# Patient Record
Sex: Female | Born: 1940 | Race: White | Hispanic: No | Marital: Married | Smoking: Never smoker
Health system: Southern US, Community
[De-identification: ages and names within clinical notes are randomized; demographics above are authoritative.]

## PROBLEM LIST (undated history)

## (undated) DIAGNOSIS — Z800 Family history of malignant neoplasm of digestive organs: Secondary | ICD-10-CM

## (undated) DIAGNOSIS — M1990 Unspecified osteoarthritis, unspecified site: Secondary | ICD-10-CM

## (undated) DIAGNOSIS — E079 Disorder of thyroid, unspecified: Secondary | ICD-10-CM

## (undated) DIAGNOSIS — F329 Major depressive disorder, single episode, unspecified: Secondary | ICD-10-CM

## (undated) DIAGNOSIS — K589 Irritable bowel syndrome without diarrhea: Secondary | ICD-10-CM

## (undated) DIAGNOSIS — F32A Depression, unspecified: Secondary | ICD-10-CM

## (undated) DIAGNOSIS — K5730 Diverticulosis of large intestine without perforation or abscess without bleeding: Secondary | ICD-10-CM

## (undated) DIAGNOSIS — K219 Gastro-esophageal reflux disease without esophagitis: Secondary | ICD-10-CM

## (undated) DIAGNOSIS — E7800 Pure hypercholesterolemia, unspecified: Secondary | ICD-10-CM

## (undated) DIAGNOSIS — I10 Essential (primary) hypertension: Secondary | ICD-10-CM

## (undated) DIAGNOSIS — F419 Anxiety disorder, unspecified: Secondary | ICD-10-CM

## (undated) HISTORY — DX: Disorder of thyroid, unspecified: E07.9

## (undated) HISTORY — DX: Pure hypercholesterolemia, unspecified: E78.00

## (undated) HISTORY — DX: Irritable bowel syndrome, unspecified: K58.9

## (undated) HISTORY — DX: Family history of malignant neoplasm of digestive organs: Z80.0

## (undated) HISTORY — DX: Diverticulosis of large intestine without perforation or abscess without bleeding: K57.30

## (undated) HISTORY — DX: Essential (primary) hypertension: I10

## (undated) HISTORY — DX: Unspecified osteoarthritis, unspecified site: M19.90

## (undated) HISTORY — DX: Anxiety disorder, unspecified: F41.9

## (undated) HISTORY — DX: Depression, unspecified: F32.A

## (undated) HISTORY — PX: KNEE SURGERY: SHX244

## (undated) HISTORY — DX: Major depressive disorder, single episode, unspecified: F32.9

## (undated) HISTORY — DX: Gastro-esophageal reflux disease without esophagitis: K21.9

---

## 1974-06-07 HISTORY — PX: PARTIAL HYSTERECTOMY: SHX80

## 1999-01-15 ENCOUNTER — Other Ambulatory Visit: Admission: RE | Admit: 1999-01-15 | Discharge: 1999-01-15 | Payer: Self-pay | Admitting: *Deleted

## 2001-05-28 ENCOUNTER — Encounter: Payer: Self-pay | Admitting: Emergency Medicine

## 2001-05-28 ENCOUNTER — Emergency Department (HOSPITAL_COMMUNITY): Admission: EM | Admit: 2001-05-28 | Discharge: 2001-05-28 | Payer: Self-pay | Admitting: Emergency Medicine

## 2003-03-15 ENCOUNTER — Ambulatory Visit (HOSPITAL_COMMUNITY): Admission: RE | Admit: 2003-03-15 | Discharge: 2003-03-15 | Payer: Self-pay | Admitting: *Deleted

## 2004-08-24 ENCOUNTER — Encounter: Admission: RE | Admit: 2004-08-24 | Discharge: 2004-08-24 | Payer: Self-pay | Admitting: Family Medicine

## 2006-11-16 ENCOUNTER — Encounter: Admission: RE | Admit: 2006-11-16 | Discharge: 2006-11-16 | Payer: Self-pay | Admitting: Family Medicine

## 2006-11-21 ENCOUNTER — Encounter: Admission: RE | Admit: 2006-11-21 | Discharge: 2006-11-21 | Payer: Self-pay | Admitting: Family Medicine

## 2007-01-16 ENCOUNTER — Ambulatory Visit (HOSPITAL_COMMUNITY): Admission: RE | Admit: 2007-01-16 | Discharge: 2007-01-16 | Payer: Self-pay | Admitting: General Surgery

## 2007-01-16 ENCOUNTER — Encounter (INDEPENDENT_AMBULATORY_CARE_PROVIDER_SITE_OTHER): Payer: Self-pay | Admitting: General Surgery

## 2007-01-16 HISTORY — PX: CHOLECYSTECTOMY: SHX55

## 2007-05-09 ENCOUNTER — Ambulatory Visit: Payer: Self-pay | Admitting: Gastroenterology

## 2007-05-10 ENCOUNTER — Ambulatory Visit: Payer: Self-pay | Admitting: Gastroenterology

## 2007-05-10 ENCOUNTER — Encounter: Payer: Self-pay | Admitting: Gastroenterology

## 2007-05-10 DIAGNOSIS — K5730 Diverticulosis of large intestine without perforation or abscess without bleeding: Secondary | ICD-10-CM

## 2007-05-10 HISTORY — DX: Diverticulosis of large intestine without perforation or abscess without bleeding: K57.30

## 2007-06-15 ENCOUNTER — Ambulatory Visit: Payer: Self-pay | Admitting: Gastroenterology

## 2007-06-28 DIAGNOSIS — F411 Generalized anxiety disorder: Secondary | ICD-10-CM | POA: Insufficient documentation

## 2007-06-28 DIAGNOSIS — M129 Arthropathy, unspecified: Secondary | ICD-10-CM | POA: Insufficient documentation

## 2007-06-28 DIAGNOSIS — R197 Diarrhea, unspecified: Secondary | ICD-10-CM

## 2007-06-28 DIAGNOSIS — E785 Hyperlipidemia, unspecified: Secondary | ICD-10-CM

## 2007-06-28 DIAGNOSIS — E079 Disorder of thyroid, unspecified: Secondary | ICD-10-CM | POA: Insufficient documentation

## 2007-06-28 DIAGNOSIS — K5730 Diverticulosis of large intestine without perforation or abscess without bleeding: Secondary | ICD-10-CM | POA: Insufficient documentation

## 2007-06-28 DIAGNOSIS — I10 Essential (primary) hypertension: Secondary | ICD-10-CM | POA: Insufficient documentation

## 2009-09-19 ENCOUNTER — Encounter: Admission: RE | Admit: 2009-09-19 | Discharge: 2009-09-19 | Payer: Self-pay | Admitting: Family Medicine

## 2010-10-20 NOTE — Assessment & Plan Note (Signed)
[facility]                         GASTROENTEROLOGY OFFICE NOTE   NAME:Christine Huynh, Christine Huynh                       MRN:          [id number]  DATE:05/09/2007                            DOB:          [birth year]    The patient is referred for evaluation of crampy lower abdominal pain  with urgent small volume diarrhea over the last 10-12 months.   HISTORY OF PRESENT ILLNESS:  The patient first developed liquid diarrhea  with abdominal cramping earlier this year.  There apparently was initial  impression that her diarrhea was secondary to metoprolol and Effexor  that she was taking because of anxiety and depression.  However, when  these medicines were discontinued, her diarrhea persisted.  It is  associated with tenesmus and lower abdominal discomfort and episodes of  self soiling, but no nocturnal diarrhea or rectal bleeding.  She had a  rather thorough workup by Dr. Nathanial Rancher which has included negative stool  exams and laboratory parameters.  She does have a chronic anxiety  disorder which seems to be fairly well managed at this time.  She has  been given p.r.n. Levsin tablets which help as do Imodium.   The patient denies foreign travel, although, she was in [place] in the  1970's and had dysentery at that time.  There are no sick family members  at home.  She denies repeated courses of antibiotics and has had  negative stool assay for Clostridium difficile.  Stool exams for culture  and ova and parasites have also been negative.  It is felt that she  might have lactose intolerance and she was placed on Lactaid tablets  again without improvement.  She denies associated skin rashes, joint  pains, oral stomatitis, or visual difficulty.  She had a colonoscopy as  a screening procedure with what sounds like [facility] in 2005.  I do not  have that report for review.  She has not had recent colonoscopy or  barium studies.  To me, she denies any specific food  intolerance and has  had no associated weight loss or systemic complaints such as fever,  chills, etc.   PAST MEDICAL HISTORY:  The patient has had essential hypertension for  many years, hyperlipidemia, chronic thyroid dysfunction, degenerative  arthritis, and lifetime anxiety.  She had a cholecystectomy performed  in August of 2008 because of acute cholecystitis and she had a negative  intraoperative cholangiogram.  This was performed by Anselm Pancoast.  Zachery Dakins, M.D.  I do have his report for review.  She also had a  hysterectomy in 1976 because of dysplastic cells.   REVIEW OF SYSTEMS:  Otherwise noncontributory.   MEDICATIONS:  1. Diltiazem XR 180 mg a day.  2. Hyoscyamine 0.125 mg one to two several times a day as needed for      abdominal cramps.  3. Fexofenadine 180 mg a day.  4. Lorazepam 0.5 mg twice a day.  5. Fluticasone nasal spray.  6. Multivitamin.   ALLERGIES:  She has had reactions in the past to PENICILLIN associated  skin rashes.   FAMILY HISTORY:  Remarkable for  colon cancer in a paternal uncle.  Her  mother had type 2 diabetes and atherosclerotic cardiovascular disease.   SOCIAL HISTORY:  She is married and lives with her husband.  She has a  business school degree and is retired from working in the [employer].  She does not smoke and has never abused alcohol.   REVIEW OF SYSTEMS:  Otherwise noncontributory except for occasional  nonspecific palpitations and arthralgias.  She denies any other  cardiovascular, pulmonary, genitourinary, neurologic, orthopedic, or  endocrine problems.   PHYSICAL EXAMINATION:  GENERAL:  She is a healthy appearing, attractive  white female in no distress, appearing younger than her stated age.  I  cannot appreciate stigmata of chronic liver disease, or thyromegaly.  VITAL SIGNS:  She is 5 feet 3-1/2 inches and weighs 176 pounds.  Blood  pressure 152/80, pulse 88 and regular.  CHEST:  Clear.  HEART:  Regular  rhythm without murmurs, rubs, or gallops.  ABDOMEN:  No hepatosplenomegaly, abdominal masses, or significant  tenderness.  EXTREMITIES:  Peripheral extremities were unremarkable.  NEUROLOGY:  Mental status was clear.  RECTAL:  Very scant stool in the rectal vault that was guaiac negative.   ASSESSMENT:  1. Watery diarrhea most consistent with colitic process.  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  2. Well controlled hypertension with associated hypercholesterolemia.  3. History of chronic thyroid dysfunction, but she is not on thyroid      medication at this time.  4. History of penicillin allergy.  5. Status post cholecystectomy for cholelithiasis.  6. Status post hysterectomy for cervical dysplasia.   RECOMMENDATIONS:  I have gone ahead and set the patient up for  colonoscopy with random colon biopsies as soon as possible and to  proceed accordingly depending on these results.  She is to continue her  other medications and follow up with Dr. Nathanial Rancher as previously planned.     Vania Rea. Jarold Motto, MD, Caleen Essex, FAGA  Electronically Signed    DRP/MedQ  DD: 05/09/2007  DT: 05/09/2007  Job #: [id number]   cc:   Burnell Blanks, MD

## 2010-10-20 NOTE — Op Note (Signed)
Christine, Huynh                ACCOUNT NO.:  [id number]   MEDICAL RECORD NO.:  [id number]          PATIENT TYPE:  AMB   LOCATION:  DAY                          FACILITY:  [facility]   PHYSICIAN:  Anselm Pancoast. Weatherly, M.D.DATE OF BIRTH:  [birth year]   DATE OF PROCEDURE:  01/16/2007  DATE OF DISCHARGE:                               OPERATIVE REPORT   PREOPERATIVE DIAGNOSIS:  Chronic cholecystitis with stones.   POSTOPERATIVE DIAGNOSIS:  Chronic cholecystitis with stones.   OPERATION:  Laparoscopic cholecystectomy with cholangiogram.   SURGEON:  Anselm Pancoast. Zachery Dakins, M.D.   ASSISTANT:  Ollen Gross. Vernell Morgans, M.D.   ANESTHESIA:  General anesthesia.   HISTORY:  Christine Huynh is a 70 year old moderately overweight female who  was referred to me by Dr. Nathanial Rancher for symptomatic gallstones.  The  patient is on Effexor 75 mg one cap daily and metoprolol and has had  episodes of pain which first occurred about 2 months ago after eating  and then she has had some minor episodes of cramping and bloating  discomfort.  She has a little reflux esophagitis which is treated  symptomatically and she had an ultrasound of the gallbladder that showed  stones, but no thickening or signs of acute cholecystitis.  She had had  knee surgery recently by Dr. Simeon Craft, has had colonoscopy and is on  blood pressure medications.  She denies any symptoms and sound like  exertional angina.  She is a retired Museum/gallery curator.   DESCRIPTION OF PROCEDURE:  Preoperatively, she was taken to the  operative suite with induction of general anesthesia, endotracheal tube,  oral tube into the stomach and then the abdomen was prepped with  Betadine solution and draped in a sterile manner.  The patient had  received preoperatively 400 mg of Cipro, as she is allergic to  penicillin.  After induction of general anesthesia, endotracheal tube,  oral tube and then after draping, a small incision made below the  umbilicus.   It looks as if she has possibly had a laparoscopic surgery  previously.  The fascia was picked up and then carefully entered into  the peritoneal cavity.  On entering into, I could feel a little adhesion  right at the undersurface that I broke down with my finger and then a  pursestring suture was placed and a Hasson cannula introduced.  The  gallbladder was not acutely inflamed, but was dilated.  The upper 10 mL  trocar was placed in subxiphoid area in the appropriate position and the  two lateral 5-mm trocars were placed after anesthetizing the fascia by  Dr. Carolynne Edouard at the appropriate position.  The gallbladder was retracted  upward and outward and the peritoneum over the proximal portion of the  gallbladder was carefully separated.  We could identify the cystic duct  and cystic artery.  I opened up the peritoneum posterior to the lesser  side so that we could get a good window.  After we were definitely sure  of the anatomy, 2 clips were placed proximally on the cystic artery and  one distally and it was then  divided to allow [place] very good exposure of  the cystic duct.  I then placed a clip across the cystic duct-  gallbladder junction and made a little opening proximally and introduced  a Cook catheter, held in place with clip.  A cholangiogram showed a good  flow of the biliary system, intra and extrahepatic radicles and no  evidence of any dilatation or stone.  The catheter was removed.  Three  clips were placed across the proximal cystic duct and it was divided and  then the gallbladder was freed from its bed using the hook  electrocautery with good hemostasis.  I placed 1 clip on kind of the  peritoneal reflection inferior that may have a little artery there and  then divided it with no evidence of any bleeding.  The gallbladder was  placed in the EndoCatch bag and camera switched to the upper 10-mm port  and the gallbladder within the bag was removed.  The little irrigating  fluid  that was minimal was aspirated, no evidence of bleeding or bile,  and then an additional figure-of-eight suture was placed under direct  vision with the laparoscopic at the umbilicus and both it and the  pursestring suture tied.  I then anesthetized the fascia at the  umbilicus, withdrew the 5-mm ports under direct vision, released the  CO2, then withdrew the upper 10-mm port.  The subcutaneous wounds were  closed with 4-0 Vicryl and Benzoin and Steri-Strips were placed on the  skin.  The patient desires to spend the night and go home in the  morning.           ______________________________  Anselm Pancoast. Zachery Dakins, M.D.     WJW/MEDQ  D:  01/16/2007  T:  01/17/2007  Job:  [id number]   cc:   Burnell Blanks, MD  Fax: [phone]

## 2010-10-20 NOTE — Assessment & Plan Note (Signed)
[facility]                         GASTROENTEROLOGY OFFICE NOTE   NAME:Christine Huynh, Christine Huynh                       MRN:          [id number]  DATE:06/15/2007                            DOB:          28-Sep-1940    Christine Huynh underwent colonoscopy with multiple random colon biopsies on  May 10, 2007.  These biopsies were entirely normal.  She did have  significant diverticulosis.  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  She denies any other GI complaints.  Dr. Nathanial Rancher wants to know if she can go back on Effexor for her anxiety  and depression.   PHYSICAL EXAMINATION:  VITAL SIGNS:  Her vital signs are all stable, and  she weighs 172 pounds.  GENERAL:  Exam not repeated.   ASSESSMENT:  I think Christine Huynh has symptomatic diverticulosis with  sigmoid colon dysmotility and frequent stooling.  She also has an  element, I think, of bile salt enteropathy, doing better with  cholestyramine therapy.   RECOMMENDATIONS:  1. Continue cholestyramine 4 grams with juice in mid a.m. 2 hours      apart from other medications.  2. Benefiber 1 teaspoon in juice at bedtime.  3. Trial of Pamine Forte 2 mg before bedtime.   ADDENDUM:  I think it would be alright for Christine Huynh to restart Effexor at  this time and will see how she does symptomatically.  I will send these  recommendations on to Dr. Nathanial Rancher.     Christine Rea. Jarold Motto, MD, Caleen Essex, FAGA  Electronically Signed    DRP/MedQ  DD: 06/15/2007  DT: 06/15/2007  Job #: [id number]   cc:   Burnell Blanks, MD

## 2010-10-20 NOTE — Assessment & Plan Note (Signed)
 [facility]                         GASTROENTEROLOGY OFFICE NOTE   NAME:Riviere, PENI RUPARD                       MRN:          [id number]  DATE:06/15/2007                            DOB:          07-02-1940    CONTINUATION   I just dictated her note, dictation June 15, 2007.  I think it would  be alright for Kimber to restart Effexor at this time and will see how she  does symptomatically.  I will send these recommendations on to Dr.  Nathanial Rancher.     Vania Rea. Jarold Motto, MD, Caleen Essex, FAGA     DRP/MedQ  DD: 06/15/2007  DT: 06/15/2007  Job #: [id number]

## 2011-03-22 LAB — COMPREHENSIVE METABOLIC PANEL
ALT: 17
Alkaline Phosphatase: 68
CO2: 27
Glucose, Bld: 101 — ABNORMAL HIGH
Potassium: 4.2
Sodium: 141
Total Protein: 7.1

## 2011-03-22 LAB — DIFFERENTIAL
Basophils Relative: 0
Eosinophils Absolute: 0.1
Eosinophils Relative: 2
Monocytes Relative: 6
Neutrophils Relative %: 63

## 2011-03-22 LAB — CBC
Hemoglobin: 12.7
RBC: 4.05
RDW: 13.4

## 2011-09-21 ENCOUNTER — Encounter: Payer: Self-pay | Admitting: Gastroenterology

## 2011-10-06 ENCOUNTER — Encounter: Payer: Self-pay | Admitting: *Deleted

## 2011-10-08 ENCOUNTER — Ambulatory Visit (INDEPENDENT_AMBULATORY_CARE_PROVIDER_SITE_OTHER): Payer: Medicare Other | Admitting: Gastroenterology

## 2011-10-08 ENCOUNTER — Encounter: Payer: Self-pay | Admitting: Gastroenterology

## 2011-10-08 ENCOUNTER — Other Ambulatory Visit (INDEPENDENT_AMBULATORY_CARE_PROVIDER_SITE_OTHER): Payer: Medicare Other

## 2011-10-08 VITALS — BP 128/74 | HR 60 | Ht 63.0 in | Wt 143.0 lb

## 2011-10-08 DIAGNOSIS — K5732 Diverticulitis of large intestine without perforation or abscess without bleeding: Secondary | ICD-10-CM

## 2011-10-08 DIAGNOSIS — K5792 Diverticulitis of intestine, part unspecified, without perforation or abscess without bleeding: Secondary | ICD-10-CM

## 2011-10-08 DIAGNOSIS — R198 Other specified symptoms and signs involving the digestive system and abdomen: Secondary | ICD-10-CM

## 2011-10-08 DIAGNOSIS — E538 Deficiency of other specified B group vitamins: Secondary | ICD-10-CM

## 2011-10-08 DIAGNOSIS — K5733 Diverticulitis of large intestine without perforation or abscess with bleeding: Secondary | ICD-10-CM

## 2011-10-08 DIAGNOSIS — R1032 Left lower quadrant pain: Secondary | ICD-10-CM

## 2011-10-08 DIAGNOSIS — K219 Gastro-esophageal reflux disease without esophagitis: Secondary | ICD-10-CM

## 2011-10-08 DIAGNOSIS — R52 Pain, unspecified: Secondary | ICD-10-CM

## 2011-10-08 LAB — FERRITIN: Ferritin: 61.4 ng/mL (ref 10.0–291.0)

## 2011-10-08 LAB — BASIC METABOLIC PANEL
BUN: 10 mg/dL (ref 6–23)
Calcium: 9.3 mg/dL (ref 8.4–10.5)
Creatinine, Ser: 0.8 mg/dL (ref 0.4–1.2)
GFR: 74.28 mL/min (ref >60.00)
Potassium: 3.9 mEq/L (ref 3.5–5.1)

## 2011-10-08 LAB — SEDIMENTATION RATE: Sed Rate: 22 mm/hr (ref 0–22)

## 2011-10-08 LAB — CBC WITH DIFFERENTIAL/PLATELET
Basophils Relative: 0.6 % (ref 0.0–3.0)
Eosinophils Relative: 2.5 % (ref 0.0–5.0)
HCT: 38.9 % (ref 36.0–46.0)
Hemoglobin: 12.8 g/dL (ref 12.0–15.0)
Lymphs Abs: 1.4 10*3/uL (ref 0.7–4.0)
MCV: 96.1 fl (ref 78.0–100.0)
Monocytes Absolute: 0.3 10*3/uL (ref 0.1–1.0)
Monocytes Relative: 5.9 % (ref 3.0–12.0)
RBC: 4.05 Mil/uL (ref 3.87–5.11)
WBC: 5.6 10*3/uL (ref 4.5–10.5)

## 2011-10-08 LAB — IBC PANEL: Saturation Ratios: 26 % (ref 20.0–50.0)

## 2011-10-08 LAB — VITAMIN B12: Vitamin B-12: 647 pg/mL (ref 211–911)

## 2011-10-08 LAB — HEPATIC FUNCTION PANEL
AST: 14 U/L (ref 0–37)
Total Bilirubin: 0.6 mg/dL (ref 0.3–1.2)

## 2011-10-08 MED ORDER — CILIDINIUM-CHLORDIAZEPOXIDE 2.5-5 MG PO CAPS: 1.0000 | ORAL_CAPSULE | Freq: Three times a day (TID) | ORAL | Status: DC

## 2011-10-08 MED ORDER — MOVIPREP 100 G PO SOLR: 1.0000 | Freq: Once | ORAL | Status: DC

## 2011-10-08 MED ORDER — ESOMEPRAZOLE MAGNESIUM 40 MG PO CPDR: 40.0000 mg | DELAYED_RELEASE_CAPSULE | Freq: Every day | ORAL | Status: DC

## 2011-10-08 NOTE — Progress Notes (Addendum)
History of Present Illness:  This is a 71 year old Caucasian female who I previously saw in 2008 with symptomatic diverticulosis. At that time colonoscopy otherwise was unremarkable including random colon biopsies. 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 She now presents with one-month history of left lower quadrant discomfort partially relieved by Ceturoxone 500 mg twice a day for 10 days by her primary care physician. Patient has alternating diarrhea and constipation, continued vague discomfort in her left lower quadrant, but no fever, chills, melena, or rectal bleeding. She does complain of some gas and bloating but no systemic complaints. In addition, she also has had the onset of substernal-subxiphoid pain with some dysphagia. When necessary Levsin has not helped her discomfort. Family history is remarkable for an uncle with colon cancer her mother had colon polyps. She has a regular diet and denies use of sorbitol, or fructose, but does use lactose-free milk. There is no history of abuse of alcohol, cigarettes, or NSAIDs. In addition to her cholecystectomy she has had a partial hysterectomy.  I have reviewed this patient's present history, medical and surgical past history, allergies and medications.     ROS: The remainder of the 10 point ROS is negative.. she does complain of allergic sinusitis, chronic anxiety and depression, degenerative arthritis of her hands and neck, chronic low back pain, drive spell some mild confusion, chronic fatigue, and recurrent sinusitis with headaches.     Physical Exam:Blood pressure 128/74, pulse 64 and regular, weight 143 pounds.  General well developed well nourished patient in no acute distress, appearing their stated age Eyes PERRLA, no icterus, fundoscopic exam per opthamologist Skin no lesions noted Neck supple, no adenopathy, no thyroid enlargement, no tenderness Chest clear to percussion and  auscultation Heart no significant murmurs, gallops or rubs noted Abdomen no hepatosplenomegaly masses or tenderness, BS normal.  Rectal inspection normal no fissures, or fistulae noted.  No masses or tenderness on digital exam. Stool guaiac negative. Extremities no acute joint lesions, edema, phlebitis or evidence of cellulitis. Neurologic patient oriented x 3, cranial nerves intact, no focal neurologic deficits noted. Psychological mental status normal and normal affect.  Assessment and plan:Resolving diverticulitis, rule out persistent inflammation, fistulization, or sigmoid stenosis. I've scheduled her for CT scan of the abdomen, CBC, sed rate, and metabolic profile. I prescribed a high fiber diet with daily Metamucil, liberal by mouth fluids, Librax 3 times a day, and also Nexium 40 mg a day for chronic acid reflux. Because of her dysphagia, I have scheduled her for endoscopy with propofol sedation, and have tentatively scheduled colonoscopy in several weeks depending on her CT scan and clinical course. She's to continue her other medications as listed and reviewed her records.  Encounter Diagnoses  Name Primary?  [name] LLQ abdominal pain Yes  . Diverticulitis

## 2011-10-08 NOTE — Patient Instructions (Addendum)
Your physician has requested that you go to the basement for the following lab work before leaving today:BUN, Creatine.  We have sent the following medications to your pharmacy for you to pick up at your convenience:Librax and Nexium. Start taking Metamucil daily.  High Fiber Diet A high fiber diet changes your normal diet to include more whole grains, legumes, fruits, and vegetables. Changes in the diet involve replacing refined carbohydrates with unrefined foods. The calorie level of the diet is essentially unchanged. The Dietary Reference Intake (recommended amount) for adult males is 38 g per day. For adult females, it is 25 g per day. Pregnant and lactating women should consume 28 g of fiber per day. Fiber is the intact part of a plant that is not broken down during digestion. Functional fiber is fiber that has been isolated from the plant to provide a beneficial effect in the body. PURPOSE  Increase stool bulk.   Ease and regulate bowel movements.   Lower cholesterol.  INDICATIONS THAT YOU NEED MORE FIBER  Constipation and hemorrhoids.   Uncomplicated diverticulosis (intestine condition) and irritable bowel syndrome.   Weight management.   As a protective measure against hardening of the arteries (atherosclerosis), diabetes, and cancer.  NOTE OF CAUTION If you have a digestive or bowel problem, ask your caregiver for advice before adding high fiber foods to your diet. Some of the following medical problems are such that a high fiber diet should not be used without consulting your caregiver:  Acute diverticulitis (intestine infection).   Partial small bowel obstructions.   Complicated diverticular disease involving bleeding, rupture (perforation), or abscess (boil, furuncle).   Presence of autonomic neuropathy (nerve damage) or gastric paresis (stomach cannot empty itself).  GUIDELINES FOR INCREASING FIBER  Start adding fiber to the diet slowly. A gradual increase of about 5  more grams (2 slices of whole-wheat bread, 2 servings of most fruits or vegetables, or 1 bowl of high fiber cereal) per day is best. Too rapid an increase in fiber may result in constipation, flatulence, and bloating.   Drink enough water and fluids to keep your urine clear or pale yellow. Water, juice, or caffeine-free drinks are recommended. Not drinking enough fluid may cause constipation.   Eat a variety of high fiber foods rather than one type of fiber.   Try to increase your intake of fiber through using high fiber foods rather than fiber pills or supplements that contain small amounts of fiber.   The goal is to change the types of food eaten. Do not supplement your present diet with high fiber foods, but replace foods in your present diet.  INCLUDE A VARIETY OF FIBER SOURCES  Replace refined and processed grains with whole grains, canned fruits with fresh fruits, and incorporate other fiber sources. White rice, white breads, and most bakery goods contain little or no fiber.   Brown whole-grain rice, buckwheat oats, and many fruits and vegetables are all good sources of fiber. These include: broccoli, Brussels sprouts, cabbage, cauliflower, beets, sweet potatoes, white potatoes (skin on), carrots, tomatoes, eggplant, squash, berries, fresh fruits, and dried fruits.   Cereals appear to be the richest source of fiber. Cereal fiber is found in whole grains and bran. Bran is the fiber-rich outer coat of cereal grain, which is largely removed in refining. In whole-grain cereals, the bran remains. In breakfast cereals, the largest amount of fiber is found in those with "bran" in their names. The fiber content is sometimes indicated on the label.  You may need to include additional fruits and vegetables each day.   In baking, for 1 cup white flour, you may use the following substitutions:   1 cup whole-wheat flour minus 2 tbs.    cup white flour plus  cup whole-wheat flour.  Document  Released: 05/24/2005 Document Revised: 05/13/2011 Document Reviewed: 04/01/2009 [facility] Patient Information 2012 [place], [place].      You have been scheduled for a CT scan of the abdomen and pelvis at [facility] ([address]---this is in the same building as Architectural technologist).   You are scheduled on 10/11/11 at 9:00am. You should arrive 15 minutes prior to your appointment time for registration. Please follow the written instructions below on the day of your exam:  WARNING: IF YOU ARE ALLERGIC TO IODINE/X-RAY DYE, PLEASE NOTIFY RADIOLOGY IMMEDIATELY AT [phone]! YOU WILL BE GIVEN A 13 HOUR PREMEDICATION PREP.  1) Do not eat or drink anything after 5:00am (4 hours prior to your test) 2) You have been given 2 bottles of oral contrast to drink. The solution may taste               better if refrigerated, but do NOT add ice or any other liquid to this solution. Shake             well before drinking.    Drink 1 bottle of contrast @ 7:00am (2 hours prior to your exam)  Drink 1 bottle of contrast @ 8:00am (1 hour prior to your exam)  You may take any medications as prescribed with a small amount of water except for the following: Metformin, Glucophage, Glucovance, Avandamet, Riomet, Fortamet, Actoplus Met, Janumet, Glumetza or Metaglip. The above medications must be held the day of the exam AND 48 hours after the exam.  The purpose of you drinking the oral contrast is to aid in the visualization of your intestinal tract. The contrast solution may cause some diarrhea. Before your exam is started, you will be given a small amount of fluid to drink. Depending on your individual set of symptoms, you may also receive an intravenous injection of x-ray contrast/dye. Plan on being at [facility] for 30 minutes or long, depending on the type of exam you are having performed.  If you have any questions regarding your exam or if you need to reschedule, you may call the CT  department at [phone] between the hours of 8:00 am and 5:00 pm, Monday-Friday.  ________________________________________________________________________ Bonita Quin have been scheduled for an endoscopy and colonoscopy with propofol. Please follow the written instructions given to you at your visit today. Please pick up your prep at the pharmacy within the next 1-3 days.  cc: Burnell Blanks, MD

## 2011-10-11 ENCOUNTER — Ambulatory Visit (INDEPENDENT_AMBULATORY_CARE_PROVIDER_SITE_OTHER)
Admission: RE | Admit: 2011-10-11 | Discharge: 2011-10-11 | Disposition: A | Discharge status: Home / Self Care | Payer: Medicare Other | Source: Ambulatory Visit | Attending: Gastroenterology | Admitting: Gastroenterology

## 2011-10-11 DIAGNOSIS — R1032 Left lower quadrant pain: Secondary | ICD-10-CM

## 2011-10-11 DIAGNOSIS — K5792 Diverticulitis of intestine, part unspecified, without perforation or abscess without bleeding: Secondary | ICD-10-CM

## 2011-10-11 DIAGNOSIS — K5732 Diverticulitis of large intestine without perforation or abscess without bleeding: Secondary | ICD-10-CM

## 2011-10-11 LAB — CELIAC PANEL 10
Gliadin IgA: 1.7 U/mL (ref <20)
IgA: 103 mg/dL (ref 69–380)

## 2011-10-11 MED ORDER — IOHEXOL 300 MG/ML  SOLN
100.0000 mL | Freq: Once | INTRAMUSCULAR | Status: AC | PRN
  Administered 2011-10-11: 100 mL via INTRAVENOUS

## 2011-11-10 ENCOUNTER — Ambulatory Visit (AMBULATORY_SURGERY_CENTER): Payer: Medicare Other | Admitting: Gastroenterology

## 2011-11-10 ENCOUNTER — Encounter: Payer: Self-pay | Admitting: Gastroenterology

## 2011-11-10 VITALS — BP 145/76 | HR 84 | Temp 97.1°F | Resp 12 | Ht 63.0 in | Wt 143.0 lb

## 2011-11-10 DIAGNOSIS — R1032 Left lower quadrant pain: Secondary | ICD-10-CM

## 2011-11-10 DIAGNOSIS — Z1211 Encounter for screening for malignant neoplasm of colon: Secondary | ICD-10-CM

## 2011-11-10 DIAGNOSIS — N736 Female pelvic peritoneal adhesions (postinfective): Secondary | ICD-10-CM

## 2011-11-10 DIAGNOSIS — K219 Gastro-esophageal reflux disease without esophagitis: Secondary | ICD-10-CM

## 2011-11-10 DIAGNOSIS — K5732 Diverticulitis of large intestine without perforation or abscess without bleeding: Secondary | ICD-10-CM

## 2011-11-10 MED ORDER — SODIUM CHLORIDE 0.9 % IV SOLN: 500.0000 mL | INTRAVENOUS | Status: DC

## 2011-11-10 NOTE — Progress Notes (Signed)
Patient did not experience any of the following events: a burn prior to discharge; a fall within the facility; wrong site/side/patient/procedure/implant event; or a hospital transfer or hospital admission upon discharge from the facility. (G8907) Patient did not have preoperative order for IV antibiotic SSI prophylaxis. (G8918)  

## 2011-11-10 NOTE — Op Note (Signed)
[facility] [address] [place], [place]  [address]  ENDOSCOPY PROCEDURE REPORT  PATIENT:  Christine Huynh, Christine Huynh  MR#:  [id number] BIRTHDATE:  01-May-1941, 71 yrs. old  GENDER:  female  ENDOSCOPIST:  Vania Rea. Jarold Motto, MD, [facility] Referred by:  PROCEDURE DATE:  11/10/2011 PROCEDURE:  EGD, diagnostic 62130 ASA CLASS:  Class III INDICATIONS:  GERD ?? dysphagia.resolved on PPi Rx  MEDICATIONS:   There was residual sedation effect present from prior procedure., propofol (Diprivan) 60 mg IV TOPICAL ANESTHETIC:  DESCRIPTION OF PROCEDURE:   After the risks and benefits of the procedure were explained, informed consent was obtained.  [facility] GIF-H180 [id number] endoscope was introduced through the mouth and advanced to the second portion of the duodenum.  The instrument was slowly withdrawn as the mucosa was fully examined. <<PROCEDUREIMAGES>>  The upper, middle, and distal third of the esophagus were carefully inspected and no abnormalities were noted. The z-line was well seen at the GEJ. The endoscope was pushed into the fundus which was normal including a retroflexed view. The antrum,gastric body, first and second part of the duodenum were unremarkable. Retroflexed views revealed no abnormalities.    The scope was then withdrawn from the patient and the procedure completed.  COMPLICATIONS:  None  ENDOSCOPIC IMPRESSION: 1) Normal EGD NONEROSIVE GERD ON PPI RX. RECOMMENDATIONS: 1) continue PPI 2) continue current medications  ______________________________ Vania Rea. Jarold Motto, MD, Clementeen Graham  CC:  Burnell Blanks, MD  n. Christine DoctorVania Rea. Symir Mah at 11/10/2011 02:10 PM  Trenton Gammon, [phone]

## 2011-11-10 NOTE — Patient Instructions (Signed)
YOU HAD AN ENDOSCOPIC PROCEDURE TODAY AT THE [facility]: Refer to the procedure report that was given to you for any specific questions about what was found during the examination.  If the procedure report does not answer your questions, please call your gastroenterologist to clarify.  If you requested that your care partner not be given the details of your procedure findings, then the procedure report has been included in a sealed envelope for you to review at your convenience later.  YOU SHOULD EXPECT: Some feelings of bloating in the abdomen. Passage of more gas than usual.  Walking can help get rid of the air that was put into your GI tract during the procedure and reduce the bloating. If you had a lower endoscopy (such as a colonoscopy or flexible sigmoidoscopy) you may notice spotting of blood in your stool or on the toilet paper. If you underwent a bowel prep for your procedure, then you may not have a normal bowel movement for a few days.  DIET: Your first meal following the procedure should be a light meal and then it is ok to progress to your normal diet.  A half-sandwich or bowl of soup is an example of a good first meal.  Heavy or fried foods are harder to digest and may make you feel nauseous or bloated.  Likewise meals heavy in dairy and vegetables can cause extra gas to form and this can also increase the bloating.  Drink plenty of fluids but you should avoid alcoholic beverages for 24 hours.  ACTIVITY: Your care partner should take you home directly after the procedure.  You should plan to take it easy, moving slowly for the rest of the day.  You can resume normal activity the day after the procedure however you should NOT DRIVE or use heavy machinery for 24 hours (because of the sedation medicines used during the test).    SYMPTOMS TO REPORT IMMEDIATELY: A gastroenterologist can be reached at any hour.  During normal business hours, 8:30 AM to 5:00 PM Monday through Friday,  call [phone].  After hours and on weekends, please call the GI answering service at [phone] who will take a message and have the physician on call contact you.   Following lower endoscopy (colonoscopy or flexible sigmoidoscopy):  Excessive amounts of blood in the stool  Significant tenderness or worsening of abdominal pains  Swelling of the abdomen that is new, acute  Fever of 100F or higher  Following upper endoscopy (EGD)  Vomiting of blood or coffee ground material  New chest pain or pain under the shoulder blades  Painful or persistently difficult swallowing  New shortness of breath  Fever of 100F or higher  Black, tarry-looking stools  FOLLOW UP: If any biopsies were taken you will be contacted by phone or by letter within the next 1-3 weeks.  Call your gastroenterologist if you have not heard about the biopsies in 3 weeks.  Our staff will call the home number listed on your records the next business day following your procedure to check on you and address any questions or concerns that you may have at that time regarding the information given to you following your procedure. This is a courtesy call and so if there is no answer at the home number and we have not heard from you through the emergency physician on call, we will assume that you have returned to your regular daily activities without incident.  SIGNATURES/CONFIDENTIALITY: You and/or your care   partner have signed paperwork which will be entered into your electronic medical record.  These signatures attest to the fact that that the information above on your After Visit Summary has been reviewed and is understood.  Full responsibility of the confidentiality of this discharge information lies with you and/or your care-partner.   Upper GI endoscopy is normal  Continue current medications.  Colonoscopy-no poyps or cancers.  Pelvic-colonic adhesions (scar tissue).  High fiber diet with liberal fluid intake.  Use  one heaping teaspoon of metamucil in liquid every day until visit with Dr. Jarold Motto in one month.  Call Dr. Norval Gable office for follow-up in one month per his request.  Call withing next couple of days for this appointment.

## 2011-11-10 NOTE — Op Note (Signed)
[facility] [address] [place], [place]  [address]  COLONOSCOPY PROCEDURE REPORT  PATIENT:  Christine Huynh, Christine Huynh  MR#:  [id number] BIRTHDATE:  Sep 20, 1940, 71 yrs. old  GENDER:  female ENDOSCOPIST:  Vania Rea. Jarold Motto, MD, [facility] REF. BY: PROCEDURE DATE:  11/10/2011 PROCEDURE:  Diagnostic Colonoscopy ASA CLASS:  Class II INDICATIONS:  Abdominal pain MEDICATIONS:   propofol (Diprivan) 200 mg IV  DESCRIPTION OF PROCEDURE:   After the risks and benefits and of the procedure were explained, informed consent was obtained. Digital rectal exam was performed and revealed no abnormalities. The LB [id number] endoscope was introduced through the anus and advanced to the cecum, which was identified by both the appendix and ileocecal valve.  The quality of the prep was excellent, using MoviPrep.  The instrument was then slowly withdrawn as the colon was fully examined. <<PROCEDUREIMAGES>>  FINDINGS:  No polyps or cancers were seen.  This was otherwise a normal examination of the colon. adhesions in sigmoid area.hard to pass. ?? stenosis.   Retroflexed views in the rectum revealed no abnormalities.    The scope was then withdrawn from the patient and the procedure completed.  COMPLICATIONS:  None ENDOSCOPIC IMPRESSION: 1) No polyps or cancers 2) Otherwise normal examination pelvic-colonic adhesions. RECOMMENDATIONS: 1) Continue current medications 2) High fiber diet with liberal fluid intake. 3) metamucil or benefiber  REPEAT EXAM:  No  ______________________________ Vania Rea. Jarold Motto, MD, Clementeen Graham  CC:  Burnell Blanks, MD  n. Rosalie DoctorVania Rea. Evea Sheek at 11/10/2011 02:05 PM  Trenton Gammon, [phone]

## 2011-11-10 NOTE — Progress Notes (Signed)
Propofol per s camp crna. See scanned intra procedure report. ewm 

## 2011-11-11 ENCOUNTER — Telehealth: Payer: Self-pay | Admitting: *Deleted

## 2011-11-11 NOTE — Telephone Encounter (Signed)
  Follow up Call-  Call back number 11/10/2011  Post procedure Call Back phone  # [phone]  Permission to leave phone message Yes     No answer and  answering machine did not pick up

## 2011-12-07 ENCOUNTER — Ambulatory Visit (INDEPENDENT_AMBULATORY_CARE_PROVIDER_SITE_OTHER): Payer: Medicare Other | Admitting: Gastroenterology

## 2011-12-07 ENCOUNTER — Encounter: Payer: Self-pay | Admitting: Gastroenterology

## 2011-12-07 VITALS — BP 120/64 | HR 84 | Ht 63.0 in | Wt 141.0 lb

## 2011-12-07 DIAGNOSIS — K589 Irritable bowel syndrome without diarrhea: Secondary | ICD-10-CM

## 2011-12-07 DIAGNOSIS — K219 Gastro-esophageal reflux disease without esophagitis: Secondary | ICD-10-CM

## 2011-12-07 MED ORDER — OMEPRAZOLE 20 MG PO CPDR: 20.0000 mg | DELAYED_RELEASE_CAPSULE | Freq: Every day | ORAL | Status: DC

## 2011-12-07 NOTE — Patient Instructions (Addendum)
Follow up as needed Discontinue Metamucil Use Fibercon as needed   Gastroesophageal Reflux Disease, Adult Gastroesophageal reflux disease (GERD) happens when acid from your stomach flows up into the esophagus. When acid comes in contact with the esophagus, the acid causes soreness (inflammation) in the esophagus. Over time, GERD may create small holes (ulcers) in the lining of the esophagus. CAUSES   Increased body weight. This puts pressure on the stomach, making acid rise from the stomach into the esophagus.   Smoking. This increases acid production in the stomach.   Drinking alcohol. This causes decreased pressure in the lower esophageal sphincter (valve or ring of muscle between the esophagus and stomach), allowing acid from the stomach into the esophagus.   Late evening meals and a full stomach. This increases pressure and acid production in the stomach.   A malformed lower esophageal sphincter.  Sometimes, no cause is found. SYMPTOMS   Burning pain in the lower part of the mid-chest behind the breastbone and in the mid-stomach area. This may occur twice a week or more often.   Trouble swallowing.   Sore throat.   Dry cough.   Asthma-like symptoms including chest tightness, shortness of breath, or wheezing.  DIAGNOSIS  Your caregiver may be able to diagnose GERD based on your symptoms. In some cases, X-rays and other tests may be done to check for complications or to check the condition of your stomach and esophagus. TREATMENT  Your caregiver may recommend over-the-counter or prescription medicines to help decrease acid production. Ask your caregiver before starting or adding any new medicines.  HOME CARE INSTRUCTIONS   Change the factors that you can control. Ask your caregiver for guidance concerning weight loss, quitting smoking, and alcohol consumption.   Avoid foods and drinks that make your symptoms worse, such as:   Caffeine or alcoholic drinks.   Chocolate.    Peppermint or mint flavorings.   Garlic and onions.   Spicy foods.   Citrus fruits, such as oranges, lemons, or limes.   Tomato-based foods such as sauce, chili, salsa, and pizza.   Fried and fatty foods.   Avoid lying down for the 3 hours prior to your bedtime or prior to taking a nap.   Eat small, frequent meals instead of large meals.   Wear loose-fitting clothing. Do not wear anything tight around your waist that causes pressure on your stomach.   Raise the head of your bed 6 to 8 inches with wood blocks to help you sleep. Extra pillows will not help.   Only take over-the-counter or prescription medicines for pain, discomfort, or fever as directed by your caregiver.   Do not take aspirin, ibuprofen, or other nonsteroidal anti-inflammatory drugs (NSAIDs).  SEEK IMMEDIATE MEDICAL CARE IF:   You have pain in your arms, neck, jaw, teeth, or back.   Your pain increases or changes in intensity or duration.   You develop nausea, vomiting, or sweating (diaphoresis).   You develop shortness of breath, or you faint.   Your vomit is green, yellow, black, or looks like coffee grounds or blood.   Your stool is red, bloody, or black.  These symptoms could be signs of other problems, such as heart disease, gastric bleeding, or esophageal bleeding. MAKE SURE YOU:   Understand these instructions.   Will watch your condition.   Will get help right away if you are not doing well or get worse.  Document Released: 03/03/2005 Document Revised: 05/13/2011 Document Reviewed: 12/11/2010 [employer] Patient Information 2012  [employer].

## 2011-12-07 NOTE — Progress Notes (Signed)
History of Present Illness: This is a 71 year old Caucasian female who recently underwent endoscopy and colonoscopy. She is completely asymptomatic on daily Nexium 40 mg and standard antireflux maneuvers. She also has no longer in left lower quadrant pain taking fiber supplements and Librax 3 times a day. She has mild depression but otherwise is healthy, and is without complaints.    Current Medications, Allergies, Past Medical History, Past Surgical History, Family History and Social History were reviewed in [employer] record.   Assessment and plan: Chronic GERD doing well on PPI therapy. We will change her medications as per her insurance plan. She cannot tolerate Metamucil, and we have substituted FiberCon pills twice a day with liberal by mouth fluids. She will followup when necessary as needed in the future. Colonoscopy revealed no evidence of diverticulosis or colitis or polyps. No diagnosis found.

## 2012-04-06 ENCOUNTER — Other Ambulatory Visit: Payer: Self-pay | Admitting: Gastroenterology

## 2012-05-16 ENCOUNTER — Other Ambulatory Visit: Payer: Self-pay | Admitting: Gastroenterology

## 2012-07-31 ENCOUNTER — Other Ambulatory Visit: Payer: Self-pay | Admitting: Dermatology

## 2012-08-21 IMAGING — CT CT ABD-PELV W/ CM
2 of 5 series · 17 of 46 positions shown, 19 images · IV contrast (Omnipaque 300)
Comparison: None.

CLINICAL DATA: Left lower quadrant pain.  Evaluate for
diverticulitis.

CT ABDOMEN AND PELVIS WITH CONTRAST
TECHNIQUE: Multidetector CT imaging of the abdomen and pelvis was
performed following the standard protocol during bolus
administration of intravenous contrast.
Contrast: 100mL OMNIPAQUE IOHEXOL 300 MG/ML  SOLN

[Series 2: abd/ pel 5mm · axial · 0.65mm/px · z∈[-390,-10]mm · 14 of 86 slices shown, 16 images]
[im 5/86  soft-tissue]
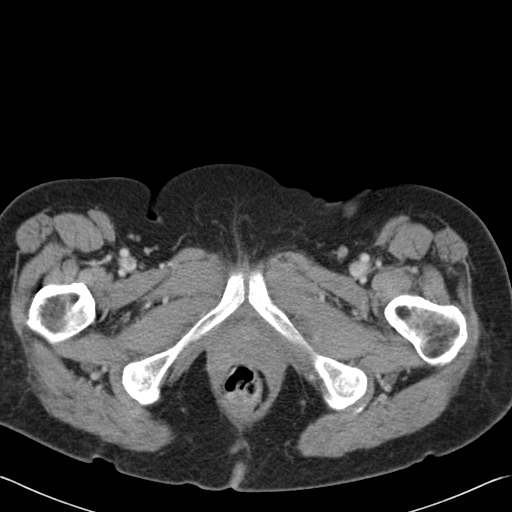
[im 5/86  bone]
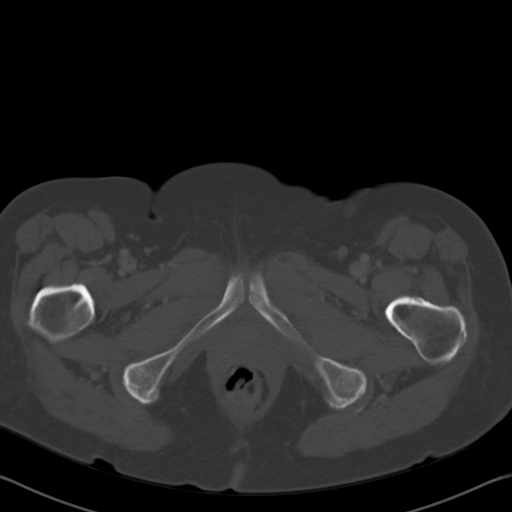
[im 13/86  soft-tissue]
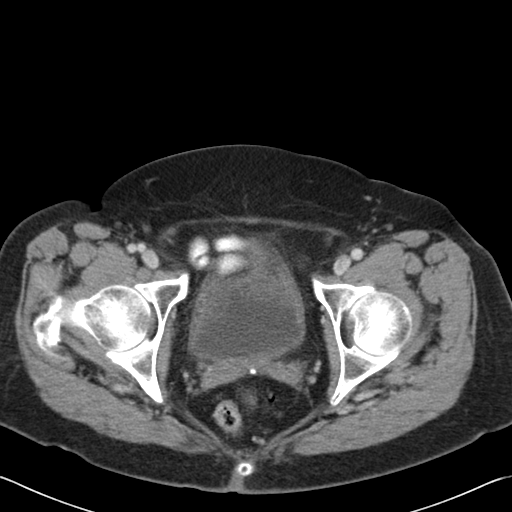
[im 18/86  soft-tissue]
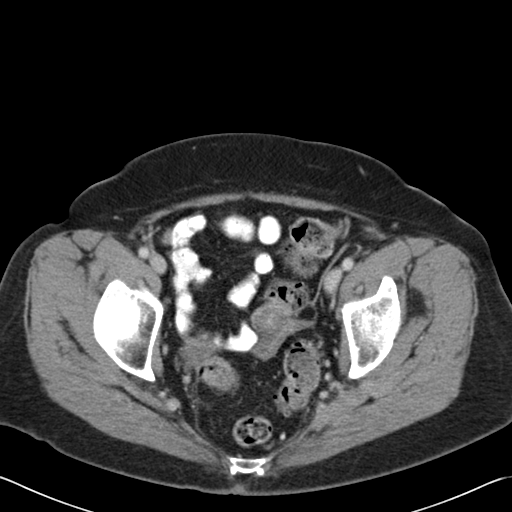
[im 22/86  soft-tissue]
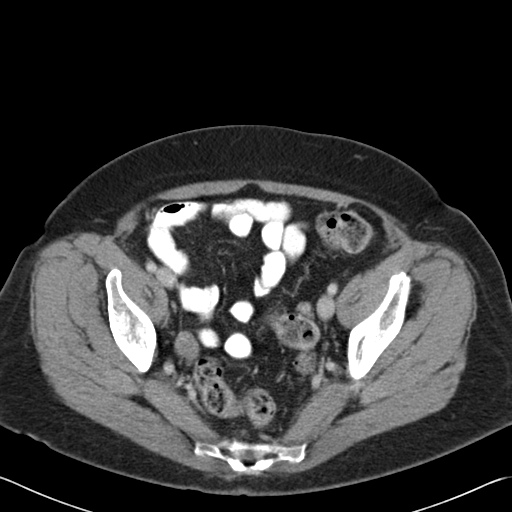
[im 30/86  soft-tissue]
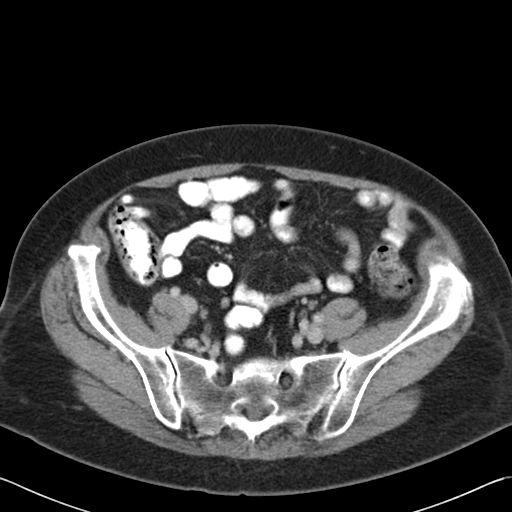
[im 35/86  soft-tissue]
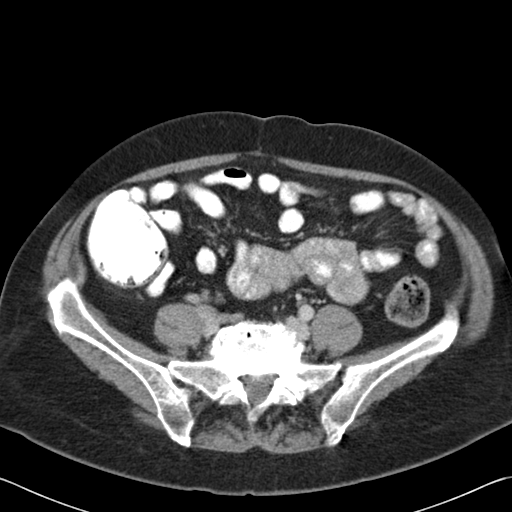
[im 39/86  soft-tissue]
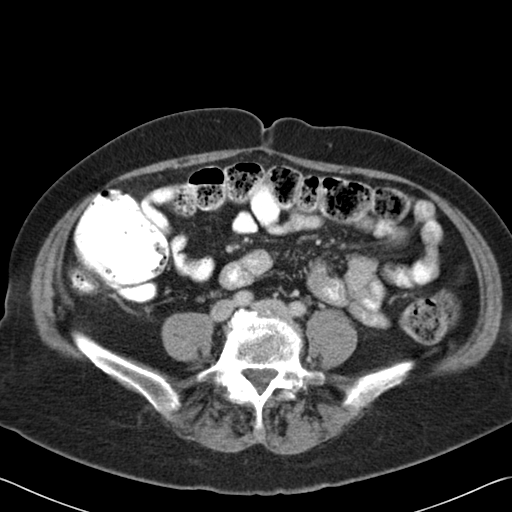
[im 47/86  soft-tissue]
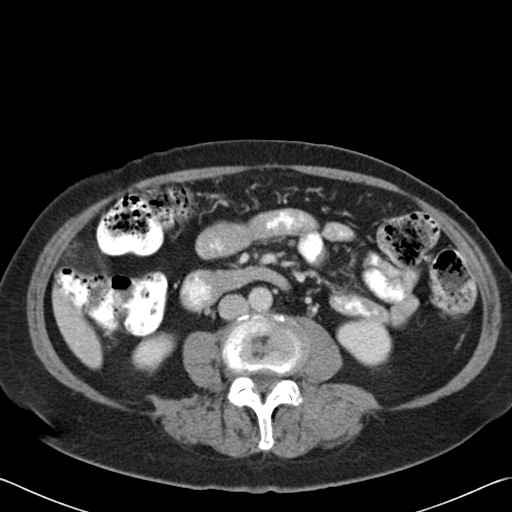
[im 52/86  soft-tissue]
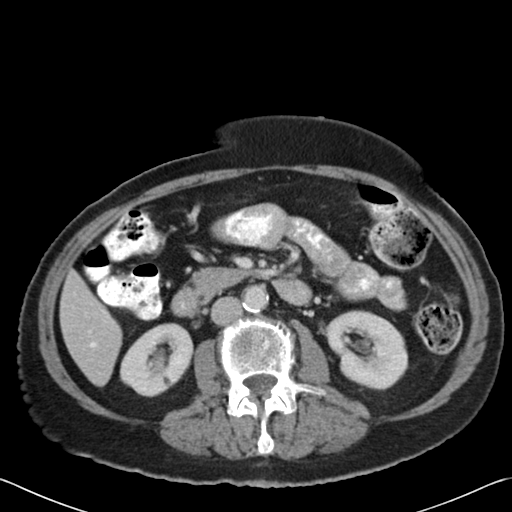
[im 52/86  bone]
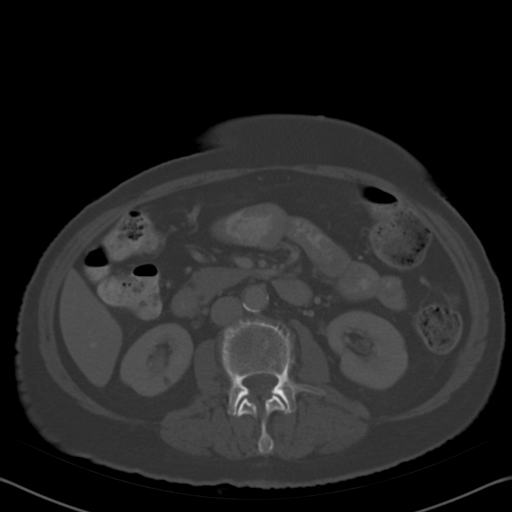
[im 56/86  soft-tissue]
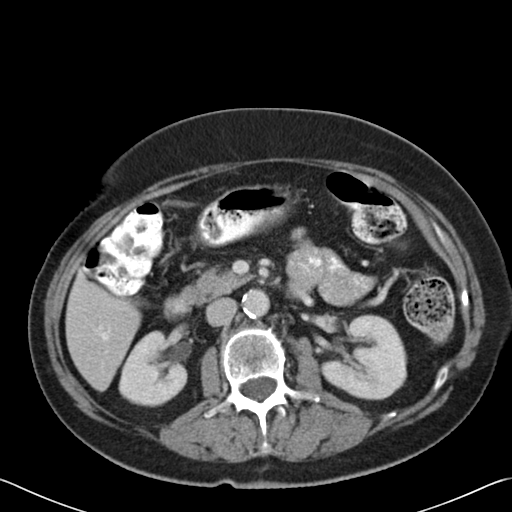
[im 64/86  soft-tissue]
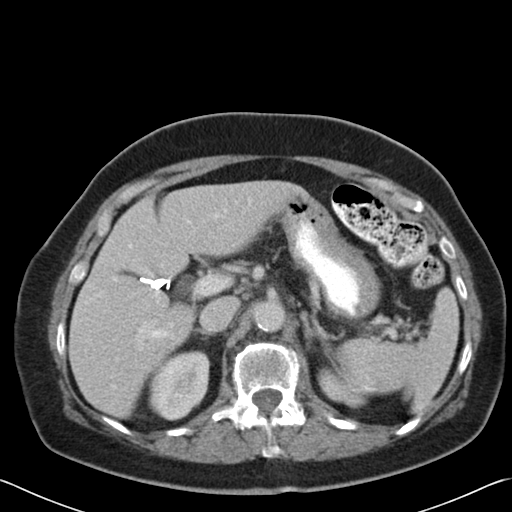
[im 69/86  soft-tissue]
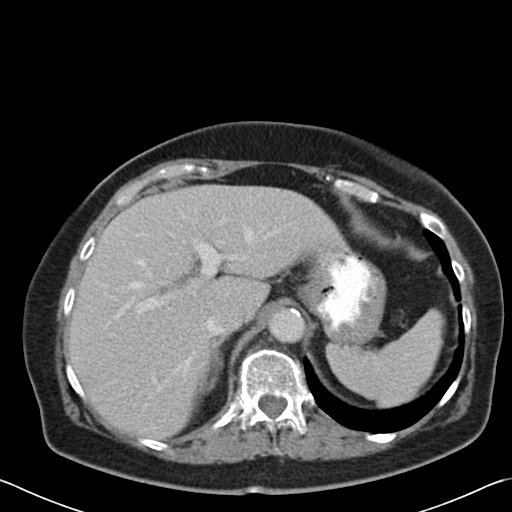
[im 73/86  soft-tissue]
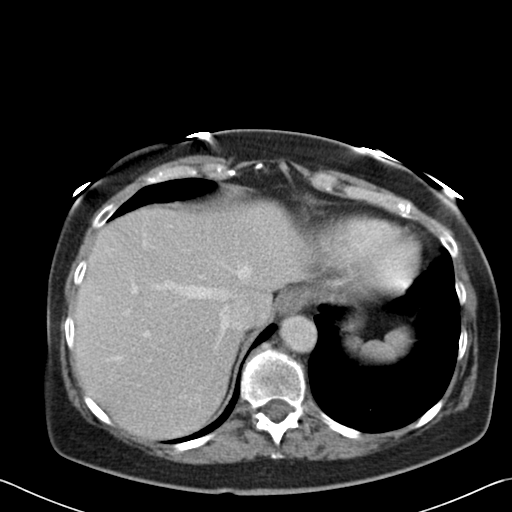
[im 81/86  soft-tissue]
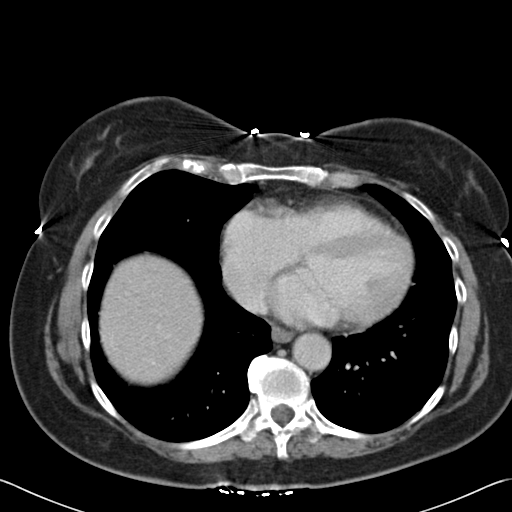

[Series 602: cor · coronal · 0.87mm/px · 3 of 98 slices shown]
[im 33/98  soft-tissue]
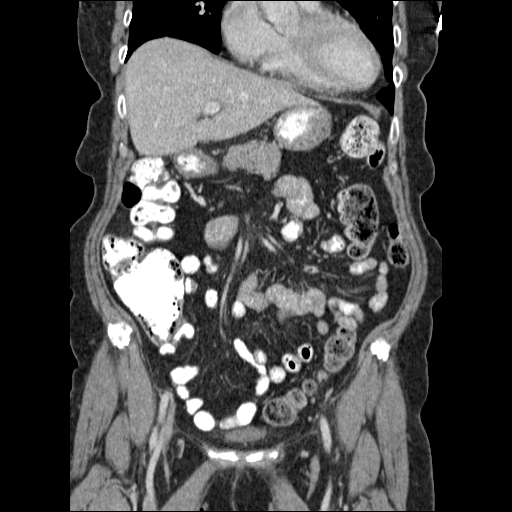
[im 44/98  soft-tissue]
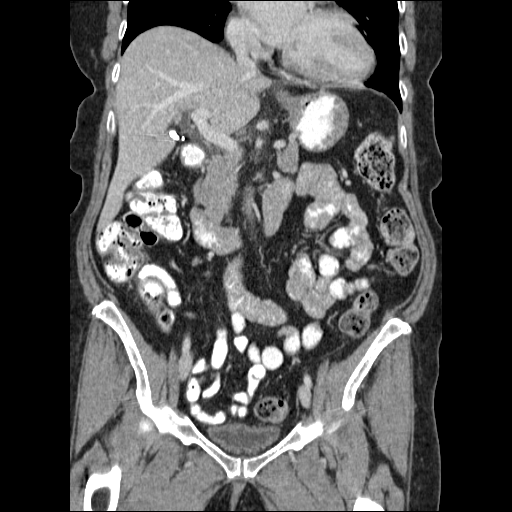
[im 54/98  soft-tissue]
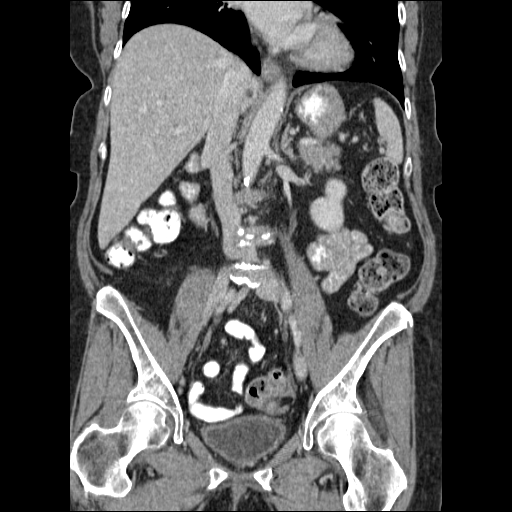

[17 of 46 positions shown; findings below may reference images not displayed]

FINDINGS: The lung bases appear clear.

There is a small cyst identified within the caudate lobe of liver
measuring 1.1 cm, image 16.  The remaining portions of the liver
appear within normal limits.

Prior cholecystectomy.

No intra or extrahepatic bile duct dilatation.

The pancreas appears within normal limits.

Both adrenal glands are normal.

The spleen is negative.

Small hypodensity within the inferior pole of the right kidney
measures 5 mm, image 35.  This is too small to characterize.

The left kidney is normal.

No upper abdominal adenopathy.

The urinary bladder appears normal.

The uterus is either surgically absent or atrophic.

The stomach appears normal.

The small bowel loops are also unremarkable.  The appendix
is identified and appears normal.

The colon is negative.

No specific features identified to suggest acute diverticulitis.

Review of the visualized osseous structures is significant for mild
scoliosis and multilevel degenerative disc disease.
IMPRESSION: 1.  No acute findings within the abdomen or pelvis.  No evidence
for diverticulitis.
2.  Prior cholecystectomy.

## 2013-01-25 ENCOUNTER — Other Ambulatory Visit: Payer: Self-pay | Admitting: Gastroenterology

## 2013-01-25 NOTE — Telephone Encounter (Signed)
Please make an office visit for further refills  

## 2013-03-25 ENCOUNTER — Other Ambulatory Visit: Payer: Self-pay | Admitting: Gastroenterology

## 2013-05-27 ENCOUNTER — Other Ambulatory Visit: Payer: Self-pay | Admitting: Gastroenterology

## 2015-02-12 ENCOUNTER — Encounter: Payer: Self-pay | Admitting: Gastroenterology

## 2015-05-07 ENCOUNTER — Encounter: Payer: Self-pay | Admitting: Gastroenterology

## 2015-05-13 ENCOUNTER — Encounter: Payer: Self-pay | Admitting: Gastroenterology

## 2015-05-13 ENCOUNTER — Ambulatory Visit (INDEPENDENT_AMBULATORY_CARE_PROVIDER_SITE_OTHER): Payer: Medicare PPO | Admitting: Gastroenterology

## 2015-05-13 VITALS — BP 120/70 | Ht 62.5 in | Wt 145.4 lb

## 2015-05-13 DIAGNOSIS — R159 Full incontinence of feces: Secondary | ICD-10-CM | POA: Diagnosis not present

## 2015-05-13 NOTE — Patient Instructions (Signed)
We have given you information on Kegel Excercises to help maintain bowel and bladder functions. Take a daily fiber supplement such as Benefiber or Citrucel.   Follow up as needed.

## 2015-05-13 NOTE — Progress Notes (Addendum)
05/13/2015 Christine Huynh [id number] 01-Jul-1940   HISTORY OF PRESENT ILLNESS:  This is a pleasant 74 year old female who is previously known to Dr. Jarold Motto. She had a colonoscopy in June 2013 at which time the study was normal.  She presents to our office today with complaints of anal leakage that has been occurring for her intermittently over the last couple of months. She says that after she eats, especially certain things like broccoli, etc. she gets a lot of gas. When she passes gas she is often having some soft stool that she passes in her underwear. She says that sometimes this even occurs when she doesn't pass gas and she is walking around then she has some leakage. Sometimes it is a decent amount of stool. She's been wearing a pad to prevent it from damaging her underwear and making a mess. Denies pain in her rectum/anus.  She denies constipation or diarrhea and says that she does move her bowels regularly without difficulty.  No other complaints of abdominal pain, rectal bleeding, etc.  Past Medical History  Diagnosis Date  . Diverticulosis of colon (without mention of hemorrhage) 05/10/2007  . Family history of malignant neoplasm of gastrointestinal tract   . Hypertension   . Hypercholesterolemia   . Anxiety   . Arthritis   . Thyroid disease   . Depression   . IBS (irritable bowel syndrome)   . GERD (gastroesophageal reflux disease)    Past Surgical History  Procedure Laterality Date  . Cholecystectomy  01/16/2007  . Partial hysterectomy  1976  . Knee surgery      right knee arthroscopic    reports that she has never smoked. She has never used smokeless tobacco. She reports that she does not drink alcohol or use illicit drugs. family history includes Colon cancer in her paternal uncle; Colon polyps in her mother; Diabetes in her mother; Esophageal cancer in her brother; Heart disease in her father; Pancreatic cancer in her cousin. Allergies  Allergen Reactions  .  Ciprofloxacin   . Penicillins       Outpatient Encounter Prescriptions as of 05/13/2015  Medication Sig  . ACETAMINOPHEN PO Take by mouth as needed.  [name] MATZIM LA 420 MG 24 hr tablet One tablet by mouth once daily  . Multiple Vitamins-Minerals (COMPLETE MULTIVITAMIN/MINERAL PO) Take by mouth.  . Multiple Vitamins-Minerals (TH COMPLETE MULTI 50+) TABS Take 1 tablet by mouth daily.  [name] omeprazole (PRILOSEC) 20 MG capsule TAKE ONE CAPSULE BY MOUTH EVERY DAY  . venlafaxine (EFFEXOR) 37.5 MG tablet Two tablets by mouth in the morning and two tablets by mouth at night  . [DISCONTINUED] LORazepam (ATIVAN) 0.5 MG tablet One tablet by mouth as needed  . [DISCONTINUED] omeprazole (PRILOSEC) 20 MG capsule TAKE ONE CAPSULE BY MOUTH EVERY DAY  . [DISCONTINUED] pravastatin (PRAVACHOL) 40 MG tablet One tablet by mouth once daily  . hyoscyamine (LEVSIN SL) 0.125 MG SL tablet As needed  . [DISCONTINUED] clidinium-chlordiazePOXIDE (LIBRAX) 2.5-5 MG per capsule Take 1 capsule by mouth 3 (three) times daily with meals.   No facility-administered encounter medications on file as of 05/13/2015.     REVIEW OF SYSTEMS  : All other systems reviewed and negative except where noted in the History of Present Illness.   PHYSICAL EXAM: BP 120/70 mmHg  Ht 5' 2.5" (1.588 m)  Wt 145 lb 6 oz (65.942 kg)  BMI 26.15 kg/m2 General: Well developed white female in no acute distress Head: Normocephalic and atraumatic Eyes:  Sclerae anicteric, conjunctiva pink. Ears: Normal auditory acuity Lungs: Clear throughout to auscultation Heart: Regular rate and rhythm Abdomen: Soft, non-distended.  Normal bowel sounds.  Non-tender. Musculoskeletal: Symmetrical with no gross deformities  Skin: No lesions on visible extremities Extremities: No edema  Neurological: Alert oriented x 4, grossly non-focal Psychological:  Alert and cooperative. 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  Negative colonoscopy 3 years ago.  Discussed Kegel exercises and she is willing to try those.  Also will have her try a daily fiber supplement such as Benefiber or Citrucel to help at more bulk to the stool and possibly absorb some extra liquid from stool.  CC:  Hamrick, Durward FortesMaura L, MD   Addendum: Reviewed and agree with initial management. She should be advised to follow-up with us if no improvement Beverley FiedlerJay M Pyrtle, MD

## 2015-11-10 ENCOUNTER — Other Ambulatory Visit: Payer: Self-pay | Admitting: Dermatology

## 2016-05-10 ENCOUNTER — Other Ambulatory Visit: Payer: Medicare Other

## 2016-05-10 ENCOUNTER — Other Ambulatory Visit (INDEPENDENT_AMBULATORY_CARE_PROVIDER_SITE_OTHER): Payer: Medicare Other

## 2016-05-10 ENCOUNTER — Ambulatory Visit (INDEPENDENT_AMBULATORY_CARE_PROVIDER_SITE_OTHER): Payer: Medicare Other | Admitting: Gastroenterology

## 2016-05-10 ENCOUNTER — Encounter: Payer: Self-pay | Admitting: Gastroenterology

## 2016-05-10 VITALS — BP 124/80 | HR 74 | Ht 62.5 in | Wt 143.0 lb

## 2016-05-10 DIAGNOSIS — R1013 Epigastric pain: Secondary | ICD-10-CM | POA: Diagnosis not present

## 2016-05-10 DIAGNOSIS — R159 Full incontinence of feces: Secondary | ICD-10-CM

## 2016-05-10 DIAGNOSIS — R110 Nausea: Secondary | ICD-10-CM

## 2016-05-10 LAB — CBC WITH DIFFERENTIAL/PLATELET
Basophils Absolute: 0 10*3/uL (ref 0.0–0.1)
Basophils Relative: 0.4 % (ref 0.0–3.0)
Eosinophils Absolute: 0.1 10*3/uL (ref 0.0–0.7)
Eosinophils Relative: 1.5 % (ref 0.0–5.0)
HCT: 35.9 % — ABNORMAL LOW (ref 36.0–46.0)
Hemoglobin: 12 g/dL (ref 12.0–15.0)
Lymphocytes Relative: 21.8 % (ref 12.0–46.0)
Lymphs Abs: 1.4 10*3/uL (ref 0.7–4.0)
MCHC: 33.4 g/dL (ref 30.0–36.0)
MCV: 92.3 fl (ref 78.0–100.0)
Monocytes Absolute: 0.5 10*3/uL (ref 0.1–1.0)
Monocytes Relative: 8.1 % (ref 3.0–12.0)
NEUTROS ABS: 4.5 10*3/uL (ref 1.4–7.7)
Neutrophils Relative %: 68.2 % (ref 43.0–77.0)
PLATELETS: 352 10*3/uL (ref 150.0–400.0)
RBC: 3.89 Mil/uL (ref 3.87–5.11)
RDW: 13.7 % (ref 11.5–15.5)
WBC: 6.5 10*3/uL (ref 4.0–10.5)

## 2016-05-10 LAB — COMPREHENSIVE METABOLIC PANEL
ALK PHOS: 90 U/L (ref 39–117)
ALT: 11 U/L (ref 0–35)
AST: 12 U/L (ref 0–37)
Albumin: 4.3 g/dL (ref 3.5–5.2)
BILIRUBIN TOTAL: 0.4 mg/dL (ref 0.2–1.2)
BUN: 16 mg/dL (ref 6–23)
CO2: 30 meq/L (ref 19–32)
Calcium: 9.3 mg/dL (ref 8.4–10.5)
Chloride: 102 mEq/L (ref 96–112)
Creatinine, Ser: 0.86 mg/dL (ref 0.40–1.20)
GFR: 68.44 mL/min (ref >60.00)
Glucose, Bld: 100 mg/dL — ABNORMAL HIGH (ref 70–99)
Potassium: 4.3 mEq/L (ref 3.5–5.1)
Sodium: 139 mEq/L (ref 135–145)
TOTAL PROTEIN: 7.3 g/dL (ref 6.0–8.3)

## 2016-05-10 LAB — LIPASE: Lipase: 17 U/L (ref 11.0–59.0)

## 2016-05-10 MED ORDER — ONDANSETRON 4 MG PO TBDP: 4.0000 mg | ORAL_TABLET | Freq: Four times a day (QID) | ORAL | 1 refills | Status: DC | PRN

## 2016-05-10 NOTE — Progress Notes (Addendum)
     05/10/2016 Christine Huynh [id number] 1940-10-15   History of Present Illness:  This is a pleasant 75 year old female.  Patient of Dr. Lauro FranklinPyrtle's (previously known to Dr. Jarold MottoPatterson).  She is here today with complaints of heartburn/reflux with nausea with associated epigastric pain.  Does not vomit.  She says that her symptoms have been occurring for the past 3-4 months.  Says that it occurs mostly in the morning on a daily basis after taking her prilosec and eating breakfast.  She also reports that 2 weeks ago she had some black stools.  Denies Pepto-bismol or iron supplement use.  Stools have now been normal color again for the past week.  EGD in 11/2011 was normal (non-erosive GERD).  She also reports ongoing fecal incontinence (was seen for this by me one year ago).  We discussed using a daily fiber supplement such as Benefiber or Citrucel at that time, but she admits she did not try that.  She says that the problem has not really worsened, has just been persistent.  Says that she tends to have 4 BM's per day that are just loose.  Feels like she does not evacuate all at one time.    She had a colonoscopy in June 2013 at which time the study was normal   Current Medications, Allergies, Past Medical History, Past Surgical History, Family History and Social History [facility].   Physical Exam: BP 124/80   Pulse 74   Ht 5' 2.5" (1.588 m)   Wt 143 lb (64.9 kg)   BMI 25.74 kg/m  General: Well developed white female in no acute distress Head: Normocephalic and atraumatic Eyes:  Sclerae anicteric, conjunctiva pink  Ears: Normal auditory acuity Lungs: Clear throughout to auscultation Heart: Regular rate and rhythm Abdomen: Soft, non-distended.  BS present.  Mild epigastric TTP. Musculoskeletal: Symmetrical with no gross deformities  Extremities: No edema  Neurological: Alert oriented x 4, grossly non-focal Psychological:  Alert and  cooperative. Normal mood and affect  Assessment and Recommendations: -GERD/nausea/epigastric pain:  Despite once daily PPI.  ? If this is related to her medication, although she has been on this for a few years.  Possibly functional dyspepsia.  Will check a CBC, CMP and lipase today.  Will schedule for EGD with Dr. Rhea BeltonPyrtle as well to rule out esophagitis, ulcer disease, etc.  Will give zofran to use prn for now. -Fecal incontinence/leakage:  Likely due to weak anal sphincter tone.  Negative colonoscopy in 11/2011.  She never tried the fiber supplements as we discussed at her last visit.  Will begin daily Benefiber or Citrucel to help bulk her stools.  Addendum: Reviewed and agree with initial management. Beverley FiedlerJay M Pyrtle, MD

## 2016-05-10 NOTE — Patient Instructions (Signed)
Your physician has requested that you go to the basement for lab work before leaving today.  We have sent the following medications to your pharmacy for you to pick up at your convenience: Zofran.   You can start over the counter fiber supplement such as Benefiber or Citracel daily.   You have been scheduled for an endoscopy. Please follow written instructions given to you at your visit today. If you use inhalers (even only as needed), please bring them with you on the day of your procedure. Your physician has requested that you go to www.[email or web address].com and enter the access code given to you at your visit today. This web site gives a general overview about your procedure. However, you should still follow specific instructions given to you by our office regarding your preparation for the procedure.

## 2016-05-10 NOTE — Progress Notes (Deleted)
     05/10/2016 Christine Huynh [id number] 1941-02-21   HISTORY OF PRESENT ILLNESS: ***         Past Medical History:  Diagnosis Date  . Anxiety   . Arthritis   . Depression   . Diverticulosis of colon (without mention of hemorrhage) 05/10/2007  . Family history of malignant neoplasm of gastrointestinal tract   . GERD (gastroesophageal reflux disease)   . Hypercholesterolemia   . Hypertension   . IBS (irritable bowel syndrome)   . Thyroid disease    Past Surgical History:  Procedure Laterality Date  . CHOLECYSTECTOMY  01/16/2007  . KNEE SURGERY     right knee arthroscopic  . PARTIAL HYSTERECTOMY  1976    reports that she has never smoked. She has never used smokeless tobacco. She reports that she does not drink alcohol or use drugs. family history includes Colon cancer in her paternal uncle; Colon polyps in her mother; Diabetes in her mother; Esophageal cancer in her brother; Heart disease in her father; Pancreatic cancer in her cousin. Allergies  Allergen Reactions  . Ciprofloxacin   . Penicillins       Outpatient Encounter Prescriptions as of 05/10/2016  Medication Sig  . ACETAMINOPHEN PO Take by mouth as needed.  [name]. MATZIM LA 420 MG 24 hr tablet One tablet by mouth once daily  . Multiple Vitamins-Minerals (COMPLETE MULTIVITAMIN/MINERAL PO) Take by mouth.  . Multiple Vitamins-Minerals (TH COMPLETE MULTI 50+) TABS Take 1 tablet by mouth daily.  [name]. omeprazole (PRILOSEC) 20 MG capsule TAKE ONE CAPSULE BY MOUTH EVERY DAY  . venlafaxine (EFFEXOR) 37.5 MG tablet Two tablets by mouth in the morning and two tablets by mouth at night  . [DISCONTINUED] hyoscyamine (LEVSIN SL) 0.125 MG SL tablet As needed   No facility-administered encounter medications on file as of 05/10/2016.      REVIEW OF SYSTEMS  : All other systems reviewed and negative except where noted in the History of Present Illness.   PHYSICAL EXAM: BP 124/80   Pulse 74   Ht 5' 2.5" (1.588 m)   Wt 143  lb (64.9 kg)   BMI 25.74 kg/m  General: Well developed white female in no acute distress Head: Normocephalic and atraumatic Eyes:  sclerae anicteric,conjunctive pink. Ears: Normal auditory acuity Neck: Supple, no masses.  Lungs: Clear throughout to auscultation Heart: Regular rate and rhythm Abdomen: Soft, nontender, non distended. No masses or hepatomegaly noted. Normal bowel sounds Rectal: *** Musculoskeletal: Symmetrical with no gross deformities  Skin: No lesions on visible extremities Extremities: No edema  Neurological: Alert oriented x 4, grossly nonfocal Cervical Nodes:  No significant cervical adenopathy Psychological:  Alert and cooperative. Normal mood and affect  ASSESSMENT AND PLAN:    CC:  No ref. provider found

## 2016-05-24 ENCOUNTER — Encounter: Payer: Self-pay | Admitting: Gastroenterology

## 2016-05-24 DIAGNOSIS — R1013 Epigastric pain: Secondary | ICD-10-CM | POA: Insufficient documentation

## 2016-05-24 DIAGNOSIS — R110 Nausea: Secondary | ICD-10-CM | POA: Insufficient documentation

## 2016-06-08 ENCOUNTER — Encounter: Payer: Self-pay | Admitting: Internal Medicine

## 2016-06-22 ENCOUNTER — Encounter: Payer: Self-pay | Admitting: Internal Medicine

## 2016-06-22 ENCOUNTER — Ambulatory Visit (AMBULATORY_SURGERY_CENTER): Payer: Medicare Other | Admitting: Internal Medicine

## 2016-06-22 VITALS — BP 139/77 | HR 77 | Temp 98.4°F | Resp 16 | Ht 62.0 in | Wt 143.0 lb

## 2016-06-22 DIAGNOSIS — K2950 Unspecified chronic gastritis without bleeding: Secondary | ICD-10-CM | POA: Diagnosis not present

## 2016-06-22 DIAGNOSIS — R1013 Epigastric pain: Secondary | ICD-10-CM | POA: Diagnosis present

## 2016-06-22 MED ORDER — SUCRALFATE 1 GM/10ML PO SUSP: 1.0000 g | Freq: Four times a day (QID) | ORAL | 1 refills | Status: DC

## 2016-06-22 MED ORDER — SODIUM CHLORIDE 0.9 % IV SOLN: 500.0000 mL | INTRAVENOUS | Status: AC

## 2016-06-22 NOTE — Patient Instructions (Addendum)
YOU HAD AN ENDOSCOPIC PROCEDURE TODAY AT THE [facility]:   Refer to the procedure report that was given to you for any specific questions about what was found during the examination.  If the procedure report does not answer your questions, please call your gastroenterologist to clarify.  If you requested that your care partner not be given the details of your procedure findings, then the procedure report has been included in a sealed envelope for you to review at your convenience later.  YOU SHOULD EXPECT: Some feelings of bloating in the abdomen. Passage of more gas than usual.  Walking can help get rid of the air that was put into your GI tract during the procedure and reduce the bloating.   Please Note:  You might notice some irritation and congestion in your nose or some drainage.  This is from the oxygen used during your procedure.  There is no need for concern and it should clear up in a day or so.  SYMPTOMS TO REPORT IMMEDIATELY:    Following upper endoscopy (EGD)  Vomiting of blood or coffee ground material  New chest pain or pain under the shoulder blades  Painful or persistently difficult swallowing  New shortness of breath  Fever of 100F or higher  Black, tarry-looking stools  For urgent or emergent issues, a gastroenterologist can be reached at any hour by calling [phone].   DIET:  We do recommend a small meal at first, but then you may proceed to your regular diet.  Drink plenty of fluids but you should avoid alcoholic beverages for 24 hours.  ACTIVITY:  You should plan to take it easy for the rest of today and you should NOT DRIVE or use heavy machinery until tomorrow (because of the sedation medicines used during the test).    FOLLOW UP: Our staff will call the number listed on your records the next business day following your procedure to check on you and address any questions or concerns that you may have regarding the information given to you  following your procedure. If we do not reach you, we will leave a message.  However, if you are feeling well and you are not experiencing any problems, there is no need to return our call.  We will assume that you have returned to your regular daily activities without incident.  If any biopsies were taken you will be contacted by phone or by letter within the next 1-3 weeks.  Please call us at [phone] if you have not heard about the biopsies in 3 weeks.    SIGNATURES/CONFIDENTIALITY: You and/or your care partner have signed paperwork which will be entered into your electronic medical record.  These signatures attest to the fact that that the information above on your After Visit Summary has been reviewed and is understood.  Full responsibility of the confidentiality of this discharge information lies with you and/or your care-partner. Thank-you for choosing us for your healthcare needs today.  Use your carafate as needed per directions by Dr. Rhea BeltonPyrtle.  Try benefiber for stool regulation.

## 2016-06-22 NOTE — Progress Notes (Signed)
Called to room to assist during endoscopic procedure.  Patient ID and intended procedure confirmed with present staff. Received instructions for my participation in the procedure from the performing physician.  

## 2016-06-22 NOTE — Progress Notes (Signed)
Report to PACU, RN, vss, BBS= Clear.  

## 2016-06-22 NOTE — Op Note (Signed)
[facility] Patient Name: Christine Huynh Procedure Date: 06/22/2016 1:22 PM MRN: [id number] Endoscopist: Beverley Fiedler , MD Age: 76 Referring MD:  Date of Birth: [birth year] Gender: Female Account #: [id number] Procedure:                Upper GI endoscopy Indications:              Epigastric abdominal pain Medicines:                Monitored Anesthesia Care Procedure:                Pre-Anesthesia Assessment:                           - Prior to the procedure, a History and Physical                            was performed, and patient medications and                            allergies were reviewed. The patient's tolerance of                            previous anesthesia was also reviewed. The risks                            and benefits of the procedure and the sedation                            options and risks were discussed with the patient.                            All questions were answered, and informed consent                            was obtained. Prior Anticoagulants: The patient has                            taken no previous anticoagulant or antiplatelet                            agents. ASA Grade Assessment: II - A patient with                            mild systemic disease. After reviewing the risks                            and benefits, the patient was deemed in                            satisfactory condition to undergo the procedure.                           After obtaining informed consent, the endoscope was  passed under direct vision. Throughout the                            procedure, the patient's blood pressure, pulse, and                            oxygen saturations were monitored continuously. The                            Model GIF-[id number]) scope was introduced                            through the mouth, and advanced to the second part                            of duodenum. The upper GI  endoscopy was                            accomplished without difficulty. The patient                            tolerated the procedure well. Scope In: Scope Out: Findings:                 The examined esophagus was normal.                           A 1 cm hiatal hernia was present.                           Normal mucosa was found in the entire examined                            stomach. Biopsies were taken with a cold forceps                            for histology and Helicobacter pylori testing.                           The examined duodenum was normal. Complications:            No immediate complications. Estimated Blood Loss:     Estimated blood loss was minimal. Impression:               - Normal esophagus.                           - 1 cm hiatal hernia.                           - Normal mucosa was found in the entire stomach.                            Biopsied.                           - Normal examined  duodenum. Recommendation:           - Patient has a contact number available for                            emergencies. The signs and symptoms of potential                            delayed complications were discussed with the                            patient. Return to normal activities tomorrow.                            Written discharge instructions were provided to the                            patient.                           - Resume previous diet.                           - Continue present medications.                           - Await pathology results. Beverley Fiedler, MD 06/22/2016 1:54:10 PM This report has been signed electronically.

## 2016-06-23 ENCOUNTER — Telehealth: Payer: Self-pay | Admitting: *Deleted

## 2016-06-23 NOTE — Telephone Encounter (Signed)
  Follow up Call-  Call back number 06/22/2016  Post procedure Call Back phone  # [phone]  Permission to leave phone message Yes  Some recent data might be hidden     Patient questions:  Do you have a fever, pain , or abdominal swelling? No. Pain Score  0 *  Have you tolerated food without any problems? Yes.    Have you been able to return to your normal activities? Yes.    Do you have any questions about your discharge instructions: Diet   No. Medications  No. Follow up visit  No.  Do you have questions or concerns about your Care? No.  Actions: * If pain score is 4 or above: No action needed, pain <4.

## 2016-06-30 ENCOUNTER — Encounter: Payer: Self-pay | Admitting: Internal Medicine

## 2017-08-17 ENCOUNTER — Other Ambulatory Visit: Payer: Self-pay | Admitting: Dermatology

## 2018-06-29 ENCOUNTER — Encounter: Payer: Self-pay | Admitting: Neurology

## 2018-06-29 ENCOUNTER — Ambulatory Visit: Payer: Medicare Other | Admitting: Neurology

## 2018-06-29 VITALS — BP 123/65 | HR 72 | Ht 64.5 in | Wt 145.0 lb

## 2018-06-29 DIAGNOSIS — G3184 Mild cognitive impairment, so stated: Secondary | ICD-10-CM | POA: Diagnosis not present

## 2018-06-29 DIAGNOSIS — F5104 Psychophysiologic insomnia: Secondary | ICD-10-CM

## 2018-06-29 DIAGNOSIS — R110 Nausea: Secondary | ICD-10-CM | POA: Diagnosis not present

## 2018-06-29 MED ORDER — DONEPEZIL HCL 5 MG PO TABS: 5.0000 mg | ORAL_TABLET | Freq: Two times a day (BID) | ORAL | 0 refills | Status: DC

## 2018-06-29 MED ORDER — DONEPEZIL HCL 5 MG PO TABS: 5.0000 mg | ORAL_TABLET | Freq: Two times a day (BID) | ORAL | 5 refills | Status: DC

## 2018-06-29 NOTE — Progress Notes (Signed)
Provider:  Melvyn Novasarmen  Athene Schuhmacher, M D  Referring Provider: Ailene RavelHamrick, Christine L, MD Primary Care Physician:  Christine MowElizabeth Mumaw, MD  Chief Complaint  Patient presents with  . New Patient (Initial Visit)    pt with husband, rm 10. pt states that she has noticed a decline in memory around 6 mths ago and is gradually getting worse. MRI  completed.    HPI:  Christine Huynh is a 78 y.o. female patient has a history of insomnia, nocturia and memory loss. She is seen here on 06-29-2018 in a referral from Dr. Omer JackMumaw for memory loss.    Christine Huynh describes that for the last 18 months she has been increasingly poor rate.  She describes situations where she just cannot find the right word in the midst of a conversation, cannot find a name and becomes increasingly frustrated.  She also displaces objects cannot find her keys or her pocket book.  She knows that nobody else has taken it but it is frustrating nonetheless to look for it. She has not gotten lost but she has noticed great difficulties with giving way descriptions to her husband.  If she wants to tell him how to drive or go towards a certain place she often finds that her thoughts just break off she cannot go beyond a certain point, and sometimes she has just veered off the topic with something completely unrelated.  She feels like she is pulling at blank there is no way to try his back and try again to fill in the gaps. She still cannot estimate the cost of a certain shopping list, but she has to write things down more more often.  Her primary care physician provided a Mini-Mental Status Examination and the MMSE score of October 2018 was 29 out of 30 this was the first time she had mentioned to her her concern about memory.  The patient has the following past medical history listed, tinnitus, osteopenia both hips by DEXA scan identified as borderline osteoporotic in 2013.  She has gastroesophageal reflux disease, high magnesium high cholesterol levels,  insomnia, hypertension, anxiety and irritable bowel, diverticulosis.   Patient's Father died with Alzheimer's dementia at age 77 but dx at age 77.    Review of Systems: Out of a complete 14 system review, the patient complains of only the following symptoms, and all other reviewed systems are negative.   Social History   Socioeconomic History  . Marital status: Married    Spouse name: Not on file  . Number of children: 2  . Years of education: Not on file  . Highest education level: Not on file  Occupational History  . Occupation: Retired    Associate [employer]ployer: [employer]  Social Needs  . Financial resource strain: Not on file  . Food insecurity:    Worry: Not on file    Inability: Not on file  . Transportation needs:    Medical: Not on file    Non-medical: Not on file  Tobacco Use  . Smoking status: Never Smoker  . Smokeless tobacco: Never Used  Substance and Sexual Activity  . Alcohol use: No  . Drug use: No  . Sexual activity: Not on file  Lifestyle  . Physical activity:    Days per week: Not on file    Minutes per session: Not on file  . Stress: Not on file  Relationships  . Social connections:    Talks on phone: Not on file  Gets together: Not on file    Attends religious service: Not on file    Active member of club or organization: Not on file    Attends meetings of clubs or organizations: Not on file    Relationship status: Not on file  . Intimate partner violence:    Fear of current or ex partner: Not on file    Emotionally abused: Not on file    Physically abused: Not on file    Forced sexual activity: Not on file  Other Topics Concern  . Not on file  Social History Narrative   No caffeine     Family History  Problem Relation Age of Onset  . Colon cancer Paternal Uncle   . Diabetes Mother   . Colon polyps Mother   . Heart disease Father   . Esophageal cancer Brother   . Pancreatic cancer Cousin        1 st cousin on mothers side      Past Medical History:  Diagnosis Date  . Anxiety   . Arthritis   . Depression   . Diverticulosis of colon (without mention of hemorrhage) 05/10/2007  . Family history of malignant neoplasm of gastrointestinal tract   . GERD (gastroesophageal reflux disease)   . Hypercholesterolemia   . Hypertension   . IBS (irritable bowel syndrome)   . Thyroid disease     Past Surgical History:  Procedure Laterality Date  . CHOLECYSTECTOMY  01/16/2007  . KNEE SURGERY     right knee arthroscopic  . PARTIAL HYSTERECTOMY  1976    Current Outpatient Medications  Medication Sig Dispense Refill  . ACETAMINOPHEN PO Take by mouth as needed.    [name] atorvastatin (LIPITOR) 10 MG tablet     . busPIRone (BUSPAR) 5 MG tablet     . calcium carbonate (CALCIUM 600) 600 MG TABS tablet Take by mouth.    . diltiazem (CARDIZEM LA) 420 MG 24 hr tablet     . Multiple Vitamins-Minerals (COMPLETE MULTIVITAMIN/MINERAL PO) Take by mouth.    . Multiple Vitamins-Minerals (TH COMPLETE MULTI 50+) TABS Take 1 tablet by mouth daily.    [name] omeprazole (PRILOSEC) 20 MG capsule TAKE ONE CAPSULE BY MOUTH EVERY DAY 30 capsule 3  . ondansetron (ZOFRAN ODT) 4 MG disintegrating tablet Take 1 tablet (4 mg total) by mouth every 6 (six) hours as needed for nausea or vomiting. 30 tablet 1  . venlafaxine (EFFEXOR) 37.5 MG tablet Two tablets by mouth in the morning and two tablets by mouth at night     Current Facility-Administered Medications  Medication Dose Route Frequency Provider Last Rate Last Dose  . 0.9 %  sodium chloride infusion  500 mL Intravenous Continuous Pyrtle, Carie Caddy, MD        Allergies as of 06/29/2018 - Review Complete 06/29/2018  Allergen Reaction Noted  . Ciprofloxacin  10/08/2011  . Diclofenac  06/29/2018  . Mobic [meloxicam]  06/29/2018  . Penicillins  10/08/2011    Vitals: BP 123/65   Pulse 72   Ht 5' 4.5" (1.638 m)   Wt 145 lb (65.8 kg)   BMI 24.50 kg/m  Last Weight:  Wt Readings from Last 1  Encounters:  06/29/18 145 lb (65.8 kg)   Last Height:   Ht Readings from Last 1 Encounters:  06/29/18 5' 4.5" (1.638 m)    Physical exam:  General: The patient is awake, alert and appears not in acute distress. The patient is well groomed. Head: Normocephalic, atraumatic. Neck  is supple. Mallampati 2 , neck circumference: 13.5 "  Cardiovascular:  Regular rate and rhythm, without murmurs or carotid bruit, and without distended neck veins. Respiratory: Lungs are clear to auscultation. Skin:  Without evidence of edema, or rash Trunk: BMI is normal, normal posture.  Neurologic exam : The patient is awake and alert, oriented to place and time.  Memory subjective described as increasingly impaired.  Montreal Cognitive Assessment  06/29/2018  Visuospatial/ Executive (0/5) 4  Naming (0/3) 3  Attention: Read list of digits (0/2) 2  Attention: Read list of letters (0/1) 1  Attention: Serial 7 subtraction starting at 100 (0/3) 3  Language: Repeat phrase (0/2) 1  Language : Fluency (0/1) 0  Abstraction (0/2) 2  Delayed Recall (0/5) 1  Orientation (0/6) 6  Total 23     There is a normal attention span & concentration ability. Speech is fluent with mild dysarthria, ( lisp, skipping Ds) no dysphonia or aphasia.  Mood and affect are anxious.   Cranial nerves: no anosmia and normal taste.  Pupils are equal and briskly reactive to light. No arcus senilis. No cataract surgery.  Funduscopic exam without evidence of pallor or edema. Extraocular movements  in vertical and horizontal planes intact and without nystagmus.  Visual fields by finger perimetry are intact. Hearing to finger rub intact.  Facial sensation intact to fine touch. Facial motor strength is symmetric and tongue and uvula move midline. Tongue protrusion into either cheek is normal. Shoulder shrug is normal.   Motor exam: Normal tone ,muscle bulk and symmetric strength in all extremities.  Sensory:  Fine touch, pinprick and  vibration were tested in all extremities. Proprioception was normal.  Coordination: Rapid alternating movements in the fingers/hands were normal. Finger-to-nose maneuver  normal without evidence of ataxia, dysmetria or tremor.  Gait and station: Patient walks without assistive device . Strength within normal limits. Stance is stable and normal. Tandem gait is unfragmented. Romberg testing is negative   Deep tendon reflexes: in the  upper and lower extremities are symmetric and intact.   Assessment:  After physical and neurologic examination, review of laboratory studies, imaging, neurophysiology testing and pre-existing records, assessment is that of :  I was able to review the patient's recent brain imaging [facility] [place] on 04/07/[place].  MRI with sulcus atrophy and temporal frontal atrophy- gyrus recti.  Some microvascular disease. Not inconsistent with Alzheimer dementia.   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   Plan:  Treatment plan and additional workup : I would like to start Aricept. She has no dx of cardiac arrhythmia, but experiences palpitations when under stress.   Aricept starter dose.   CMET, TSH and T4, serum protein electrophoresis. CBC diff , methylmalonic acid.    Porfirio Mylararmen Coralee Edberg MD 06/29/2018

## 2018-07-03 ENCOUNTER — Other Ambulatory Visit: Payer: Self-pay | Admitting: Neurology

## 2018-07-03 MED ORDER — DONEPEZIL HCL 10 MG PO TABS: ORAL_TABLET | ORAL | 0 refills | Status: DC

## 2018-07-04 LAB — COMPREHENSIVE METABOLIC PANEL
A/G RATIO: 1.9 (ref 1.2–2.2)
ALT: 13 IU/L (ref 0–32)
AST: 15 IU/L (ref 0–40)
Albumin: 4.5 g/dL (ref 3.7–4.7)
Alkaline Phosphatase: 95 IU/L (ref 39–117)
BUN/Creatinine Ratio: 17 (ref 12–28)
BUN: 16 mg/dL (ref 8–27)
Bilirubin Total: <0.2 mg/dL (ref 0.0–1.2)
CALCIUM: 9.4 mg/dL (ref 8.7–10.3)
CO2: 24 mmol/L (ref 20–29)
CREATININE: 0.96 mg/dL (ref 0.57–1.00)
Chloride: 102 mmol/L (ref 96–106)
GFR, EST AFRICAN AMERICAN: 66 mL/min/{1.73_m2} (ref >59)
GFR, EST NON AFRICAN AMERICAN: 58 mL/min/{1.73_m2} — AB (ref >59)
GLOBULIN, TOTAL: 2.4 g/dL (ref 1.5–4.5)
Glucose: 84 mg/dL (ref 65–99)
POTASSIUM: 4.3 mmol/L (ref 3.5–5.2)
SODIUM: 141 mmol/L (ref 134–144)
TOTAL PROTEIN: 6.9 g/dL (ref 6.0–8.5)

## 2018-07-04 LAB — CBC WITH DIFFERENTIAL/PLATELET
BASOS ABS: 0.1 10*3/uL (ref 0.0–0.2)
Basos: 1 %
EOS (ABSOLUTE): 0.2 10*3/uL (ref 0.0–0.4)
Eos: 3 %
Hematocrit: 36.8 % (ref 34.0–46.6)
Hemoglobin: 12.4 g/dL (ref 11.1–15.9)
Immature Grans (Abs): 0 10*3/uL (ref 0.0–0.1)
Immature Granulocytes: 0 %
LYMPHS ABS: 1.6 10*3/uL (ref 0.7–3.1)
LYMPHS: 23 %
MCH: 31.6 pg (ref 26.6–33.0)
MCHC: 33.7 g/dL (ref 31.5–35.7)
MCV: 94 fL (ref 79–97)
MONOS ABS: 0.6 10*3/uL (ref 0.1–0.9)
Monocytes: 8 %
NEUTROS ABS: 4.6 10*3/uL (ref 1.4–7.0)
Neutrophils: 65 %
PLATELETS: 358 10*3/uL (ref 150–450)
RBC: 3.92 x10E6/uL (ref 3.77–5.28)
RDW: 12.6 % (ref 11.7–15.4)
WBC: 7 10*3/uL (ref 3.4–10.8)

## 2018-07-04 LAB — PROTEIN ELECTROPHORESIS, SERUM
A/G Ratio: 1.5 (ref 0.7–1.7)
ALBUMIN ELP: 4.1 g/dL (ref 2.9–4.4)
Alpha 1: 0.2 g/dL (ref 0.0–0.4)
Alpha 2: 0.7 g/dL (ref 0.4–1.0)
Beta: 0.9 g/dL (ref 0.7–1.3)
GAMMA GLOBULIN: 0.9 g/dL (ref 0.4–1.8)
GLOBULIN, TOTAL: 2.8 g/dL (ref 2.2–3.9)

## 2018-07-04 LAB — METHYLMALONIC ACID, SERUM: METHYLMALONIC ACID: 365 nmol/L (ref 0–378)

## 2018-07-04 LAB — TSH+FREE T4
FREE T4: 0.96 ng/dL (ref 0.82–1.77)
TSH: 1.74 u[IU]/mL (ref 0.450–4.500)

## 2018-07-05 ENCOUNTER — Telehealth: Payer: Self-pay | Admitting: Neurology

## 2018-07-05 NOTE — Telephone Encounter (Signed)
Patient returned call and I was able to review the labs with her. Informed her to start taking a sublingual B12 and follow with her PCP to have them possibly check her b 12. Patient wrote down all that was said and verbalized understanding and was appreciative.

## 2018-07-05 NOTE — Telephone Encounter (Signed)
-----   Message from Melvyn Novas, MD sent at 06/30/2018 11:24 AM EST ----- Very good GFR for age- normal metabolic panel ( no obvious kidney, liver disease) , good thyroid function TSH, T4. No evidence on anemia - all blood cells seen in normal proportion.   I am waiting for the Serum Protein electrophoresis and indirect B 12 level.  Melvyn Novas, MD .   NOt in reach over Epic, please fax results and comments to :Cc Dennie Bible, MD

## 2018-07-05 NOTE — Telephone Encounter (Signed)
Called the patient to advise of lab results there was no answer. LVM informing the patient to call back.   **When patient calls back please advise that lab work have come back and everything appears to be in normal range. She would like the patient to ask her Primary care to repeat a vitamin b 12 lab test in hopes in can be covered through primary care through [employer]. Please advise the patient that she should start an over the counter vitamin b 12 to take under the tongue. (This is available in the vitamin section of [facility], [facility])    "Borderline high methylmalonic acid indicates lowish B 12 levels. Please ask Dr. Nathanial Rancher to repeat a direct b 12 level test, as I cannot get Medicare coverage for this test. The patient should take an under-the- tongue form of B 12 supplement."

## 2018-07-20 ENCOUNTER — Telehealth: Payer: Self-pay | Admitting: Neurology

## 2018-07-20 MED ORDER — MEMANTINE HCL 28 X 5 MG & 21 X 10 MG PO TABS: ORAL_TABLET | ORAL | 12 refills | Status: DC

## 2018-07-20 NOTE — Telephone Encounter (Signed)
Pt is complaining of leg cramps, dark stools, terrible dreams since starting donepezil (ARICEPT) 10 MG tablet. Pt feels she cannot tolerate the medication. Please call to advise

## 2018-07-20 NOTE — Telephone Encounter (Signed)
Called the patient back and asked if she had these symptoms on the 5 mg dose rather then the 10 mg dose. Pt states that she did and never was able to tolerate it to increase to the 10 mg dose.  I discussed with Dr Vickey Huger and she would like to try the patient on the namenda starter pack. I have reviewed with the patient how she would take that and sent over the order for her to the pharmacy confirmed on file. Pt verbalized understanding and was appreciative.

## 2018-08-28 ENCOUNTER — Telehealth: Payer: Self-pay | Admitting: Neurology

## 2018-08-28 ENCOUNTER — Other Ambulatory Visit: Payer: Self-pay | Admitting: Neurology

## 2018-08-28 MED ORDER — MEMANTINE HCL 10 MG PO TABS: 10.0000 mg | ORAL_TABLET | Freq: Two times a day (BID) | ORAL | 11 refills | Status: DC

## 2018-08-28 NOTE — Telephone Encounter (Signed)
Called the patient and made sure everything is going well with the namenda and she is at the 10 mg BID dose. Pt verbalized understanding of me sending in refill for that dose to the pharmacy on file.

## 2018-08-28 NOTE — Telephone Encounter (Signed)
Pt states the pharmacy told her that a new script is needed for her memantine [facility]) tablet pack [id number]

## 2018-10-02 ENCOUNTER — Telehealth: Payer: Self-pay | Admitting: Neurology

## 2018-10-02 NOTE — Telephone Encounter (Signed)
Due to current COVID 19 pandemic, our office is severely reducing in office visits until further notice, in order to minimize the risk to our patients and healthcare providers.   Called patient to offer a virtual visit. Patient declined, as she does not have the resources to participate virtually. Patient then accepted a telephone call, and I advised her to be near her phone between 10-11 AM the day of her appointment. Patient understands that she will receive a call from RN and a call from front office staff 30 minutes prior to her appointment.  Pt understands that although there may be some limitations with this type of visit, we will take all precautions to reduce any security or privacy concerns.  Pt understands that this will be treated like an in office visit and we will file with pt's insurance, and there may be a patient responsible charge related to this service.

## 2018-10-05 ENCOUNTER — Encounter: Payer: Self-pay | Admitting: Neurology

## 2018-10-05 NOTE — Telephone Encounter (Signed)
Called the pt to review her chart. lvm for the pt to call back.

## 2018-10-05 NOTE — Telephone Encounter (Signed)
patient called back and I was able to review their chart and made sure that everything was up to date. Patient aware to be ready for the apt and will wait for the call on her home number. Instructed the patient that apx 30 min prior to the appointment the front staff will contact them to make sure they are ready to go for their appointment in case there is any need for troubleshooting it can be completed prior to the appointment time. Pt verbalized understanding.

## 2018-10-05 NOTE — Addendum Note (Signed)
Addended by: Judi Cong on: 10/05/2018 04:18 PM   Modules accepted: Orders

## 2018-10-10 ENCOUNTER — Ambulatory Visit (INDEPENDENT_AMBULATORY_CARE_PROVIDER_SITE_OTHER): Payer: Medicare Other | Admitting: Neurology

## 2018-10-10 ENCOUNTER — Encounter: Payer: Self-pay | Admitting: Neurology

## 2018-10-10 ENCOUNTER — Other Ambulatory Visit: Payer: Self-pay

## 2018-10-10 DIAGNOSIS — G3184 Mild cognitive impairment, so stated: Secondary | ICD-10-CM | POA: Diagnosis not present

## 2018-10-10 MED ORDER — DONEPEZIL HCL 5 MG PO TABS: 5.0000 mg | ORAL_TABLET | Freq: Every day | ORAL | 3 refills | Status: DC

## 2018-10-10 NOTE — Patient Instructions (Signed)
Donepezil tablets What is this medicine? DONEPEZIL (doe NEP e zil) is used to treat mild to moderate dementia caused by Alzheimer's disease. This medicine may be used for other purposes; ask your health care provider or pharmacist if you have questions. COMMON BRAND NAME(S): Aricept What should I tell my health care provider before I take this medicine? They need to know if you have any of these conditions: -asthma or other lung disease -difficulty passing urine -head injury -heart disease -history of irregular heartbeat -liver disease -seizures (convulsions) -stomach or intestinal disease, ulcers or stomach bleeding -an unusual or allergic reaction to donepezil, other medicines, foods, dyes, or preservatives -pregnant or trying to get pregnant -breast-feeding How should I use this medicine? Take this medicine by mouth with a glass of water. Follow the directions on the prescription label. You may take this medicine with or without food. Take this medicine at regular intervals. This medicine is usually taken before bedtime. Do not take it more often than directed. Continue to take your medicine even if you feel better. Do not stop taking except on your doctor's advice. If you are taking the 23 mg donepezil tablet, swallow it whole; do not cut, crush, or chew it. Talk to your pediatrician regarding the use of this medicine in children. Special care may be needed. Overdosage: If you think you have taken too much of this medicine contact a poison control center or emergency room at once. NOTE: This medicine is only for you. Do not share this medicine with others. What if I miss a dose? If you miss a dose, take it as soon as you can. If it is almost time for your next dose, take only that dose, do not take double or extra doses. What may interact with this medicine? Do not take this medicine with any of the following medications: -certain medicines for fungal infections like itraconazole,  fluconazole, posaconazole, and voriconazole -cisapride -dextromethorphan; quinidine -dofetilide -dronedarone -pimozide -quinidine -thioridazine -ziprasidone This medicine may also interact with the following medications: -antihistamines for allergy, cough and cold -atropine -bethanechol -carbamazepine -certain medicines for bladder problems like oxybutynin, tolterodine -certain medicines for Parkinson's disease like benztropine, trihexyphenidyl -certain medicines for stomach problems like dicyclomine, hyoscyamine -certain medicines for travel sickness like scopolamine -dexamethasone -ipratropium -NSAIDs, medicines for pain and inflammation, like ibuprofen or naproxen -other medicines for Alzheimer's disease -other medicines that prolong the QT interval (cause an abnormal heart rhythm) -phenobarbital -phenytoin -rifampin, rifabutin or rifapentine This list may not describe all possible interactions. Give your health care provider a list of all the medicines, herbs, non-prescription drugs, or dietary supplements you use. Also tell them if you smoke, drink alcohol, or use illegal drugs. Some items may interact with your medicine. What should I watch for while using this medicine? Visit your doctor or health care professional for regular checks on your progress. Check with your doctor or health care professional if your symptoms do not get better or if they get worse. You may get drowsy or dizzy. Do not drive, use machinery, or do anything that needs mental alertness until you know how this drug affects you. What side effects may I notice from receiving this medicine? Side effects that you should report to your doctor or health care professional as soon as possible: -allergic reactions like skin rash, itching or hives, swelling of the face, lips, or tongue -feeling faint or lightheaded, falls -loss of bladder control -seizures -signs and symptoms of a dangerous change in heartbeat or  heart   rhythm like chest pain; dizziness; fast or irregular heartbeat; palpitations; feeling faint or lightheaded, falls; breathing problems -signs and symptoms of infection like fever or chills; cough; sore throat; pain or trouble passing urine -signs and symptoms of liver injury like dark yellow or brown urine; general ill feeling or flu-like symptoms; light-colored stools; loss of appetite; nausea; right upper belly pain; unusually weak or tired; yellowing of the eyes or skin -slow heartbeat or palpitations -unusual bleeding or bruising -vomiting Side effects that usually do not require medical attention (report to your doctor or health care professional if they continue or are bothersome): -diarrhea, especially when starting treatment -headache -loss of appetite -muscle cramps -nausea -stomach upset This list may not describe all possible side effects. Call your doctor for medical advice about side effects. You may report side effects to FDA at 1-800-FDA-1088. Where should I keep my medicine? Keep out of reach of children. Store at room temperature between 15 and 30 degrees C (59 and 86 degrees F). Throw away any unused medicine after the expiration date. NOTE: This sheet is a summary. It may not cover all possible information. If you have questions about this medicine, talk to your doctor, pharmacist, or health care provider.  2019 Elsevier/Gold Standard (2015-11-10 21:00:42)  

## 2018-10-10 NOTE — Progress Notes (Signed)
Provider:  Melvyn Huynh, M D  Referring Provider: Ailene Ravel, MD Primary Care Physician:  Christine Mow, MD  Virtual Visit via Telephone Note  I connected with Christine Huynh on 10/10/18 at 10:30 AM EDT by telephone and verified that I am speaking with the correct person using two identifiers.  Location: Patient: at home  Provider: at [facility] office  I discussed the limitations, risks, security and privacy concerns of performing an evaluation and management service by telephone and the availability of in person appointments. I also discussed with the patient that there may be a patient responsible charge related to this service. The patient expressed understanding and agreed to proceed.   HPI: 10-10-2018  Christine Huynh is a 78 y.o. female patient has a history of insomnia, nocturia and memory loss.  She has a phone consult today and reports she is taking B12 and Namenda, She is willing to add now  Aricept at 5 mg, which can be increased in 60-90 days to 10 mg or 5 mg BIID .    She is seen here on 06-29-2018 in a referral from Dr. Omer Huynh for memory loss. Mrs. Christine Huynh describes that for the last 18 months she has been increasingly poor rate.  She describes situations where she just cannot find the right word in the midst of a conversation, cannot find a name and becomes increasingly frustrated.  She also displaces objects cannot find her keys or her pocket book.  She knows that nobody else has taken it but it is frustrating nonetheless to look for it. She has not gotten lost but she has noticed great difficulties with giving way descriptions to her husband.  If she wants to tell him how to drive or go towards a certain place she often finds that her thoughts just break off she cannot go beyond a certain point, and sometimes she has just veered off the topic with something completely unrelated.  She feels like she is pulling at blank there is no way to try his back and try again to fill  in the gaps. She still cannot estimate the cost of a certain shopping list, but she has to write things down more more often.  Her primary care physician provided a Mini-Mental Status Examination and the MMSE score of October 2018 was 29 out of 30 this was the first time she had mentioned to her her concern about memory.  The patient has the following past medical history listed, tinnitus, osteopenia both hips by DEXA scan identified as borderline osteoporotic in 2013.  She has gastroesophageal reflux disease, high magnesium high cholesterol levels, insomnia, hypertension, anxiety and irritable bowel, diverticulosis.   Patient's Father died with Alzheimer's dementia at age 19 but dx at age 19.    Review of Systems: Out of a complete 14 system review, the patient complains of only the following symptoms, and all other reviewed systems are negative.   Social History   Socioeconomic History   Marital status: Married    Spouse name: Not on file   Number of children: 2   Years of education: Not on file   Highest education level: Not on file  Occupational History   Occupation: Retired    Associate Professor: [employer], therapeutic  Social Needs   Financial resource strain: Not on file   Food insecurity:    Worry: Not on file    Inability: Not on file   Transportation needs:    Medical: Not  on file    Non-medical: Not on file  Tobacco Use   Smoking status: Never Smoker   Smokeless tobacco: Never Used  Substance and Sexual Activity   Alcohol use: No   Drug use: No   Sexual activity: Not on file  Lifestyle   Physical activity:    Days per week: Not on file    Minutes per session: Not on file   Stress: Not on file  Relationships   Social connections:    Talks on phone: Not on file    Gets together: Not on file    Attends religious service: Not on file    Active member of club or organization: Not on file    Attends meetings of clubs or organizations: Not on file     Relationship status: Not on file   Intimate partner violence:    Fear of current or ex partner: Not on file    Emotionally abused: Not on file    Physically abused: Not on file    Forced sexual activity: Not on file  Other Topics Concern   Not on file  Social History Narrative   No caffeine     Family History  Problem Relation Age of Onset   Colon cancer Paternal Uncle    Diabetes Mother    Colon polyps Mother    Heart disease Father    Esophageal cancer Brother    Pancreatic cancer Cousin        1 st cousin on mothers side     Past Medical History:  Diagnosis Date   Anxiety    Arthritis    Depression    Diverticulosis of colon (without mention of hemorrhage) 05/10/2007   Family history of malignant neoplasm of gastrointestinal tract    GERD (gastroesophageal reflux disease)    Hypercholesterolemia    Hypertension    IBS (irritable bowel syndrome)    Thyroid disease     Past Surgical History:  Procedure Laterality Date   CHOLECYSTECTOMY  01/16/2007   KNEE SURGERY     right knee arthroscopic   PARTIAL HYSTERECTOMY  1976    Current Outpatient Medications  Medication Sig Dispense Refill   ACETAMINOPHEN PO Take by mouth as needed.     atorvastatin (LIPITOR) 10 MG tablet Take 10 mg by mouth daily at 6 PM.      busPIRone (BUSPAR) 5 MG tablet Take 5 mg by mouth 2 (two) times daily.      calcium carbonate (CALCIUM 600) 600 MG TABS tablet Take 600 mg by mouth 2 (two) times daily with a meal.      Cyanocobalamin (VITAMIN B 12 PO) Take 1,000 mcg by mouth.     diltiazem (CARDIZEM LA) 420 MG 24 hr tablet Take 420 mg by mouth daily.      Melatonin 5 MG TABS Take 1 tablet by mouth at bedtime.     memantine (NAMENDA) 10 MG tablet Take 1 tablet (10 mg total) by mouth 2 (two) times daily. 60 tablet 11   Multiple Vitamins-Minerals (COMPLETE MULTIVITAMIN/MINERAL PO) Take by mouth.     Multiple Vitamins-Minerals (TH COMPLETE MULTI 50+) TABS Take 1  tablet by mouth daily.     omeprazole (PRILOSEC) 20 MG capsule TAKE ONE CAPSULE BY MOUTH EVERY DAY 30 capsule 3   venlafaxine (EFFEXOR) 37.5 MG tablet Two tablets by mouth in the morning and two tablets by mouth at night     Current Facility-Administered Medications  Medication Dose Route Frequency Provider Last Rate  Last Dose   0.9 %  sodium chloride infusion  500 mL Intravenous Continuous Pyrtle, Carie CaddyJay M, MD        Allergies as of 10/10/2018 - Review Complete 10/05/2018  Allergen Reaction Noted   Ciprofloxacin  10/08/2011   Diclofenac  06/29/2018   Mobic [meloxicam]  06/29/2018   Penicillins  10/08/2011    Vitals: There were no vitals taken for this visit. Last Weight:  Wt Readings from Last 1 Encounters:  06/29/18 145 lb (65.8 kg)   Last Height:   Ht Readings from Last 1 Encounters:  06/29/18 5' 4.5" (1.638 m)       Memory subjective described as increasingly impaired.  Montreal Cognitive Assessment  06/29/2018  Visuospatial/ Executive (0/5) 4  Naming (0/3) 3  Attention: Read list of digits (0/2) 2  Attention: Read list of letters (0/1) 1  Attention: Serial 7 subtraction starting at 100 (0/3) 3  Language: Repeat phrase (0/2) 1  Language : Fluency (0/1) 0  Abstraction (0/2) 2  Delayed Recall (0/5) 1  Orientation (0/6) 6  Total 23    Speech is fluent with mild dysarthria, ( lisp, skipping Ds) no dysphonia or aphasia.  Mood and affect are anxious.     Assessment:  After physical and neurologic examination, review of laboratory studies, imaging, neurophysiology testing and pre-existing records, assessment is that of :  I was able to review the patient's recent brain imaging study performed [facility] [place] on 04/07/2018. MRI with sulcus atrophy and temporal frontal atrophy- gyrus recti.  Some microvascular disease. Not inconsistent with Alzheimer dementia.  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   Plan:  Treatment plan and additional workup : I would like to start Aricept. She has no dx of cardiac arrhythmia, but experiences palpitations when under stress. We started Namenda first- now we can add 5 mg Aricept.   Namenda - some dizziness and some constipation-may now tolerate Aricept. She increased bulk and fiber food. She needs reminders to hydrate.   Reports : Started to fall. Falling to the side or forward.  Her son has sometimes commented on her stooped posture, but never witnessed a near fall.   She looses train of thought when she gets distracted, and she has trouble to retrace her task, conversation.    We reviewed the results of the blood tests : CMET, TSH and T4, serum protein electrophoresis. CBC diff , methylmalonic acid.  She takes B 12 every day now. Sublingual form.      I discussed the assessment and treatment plan with the patient. The patient was provided an opportunity to ask questions and all were answered. The patient agreed with the plan and demonstrated an understanding of the instructions.   The patient was advised to call back or seek an in-person evaluation if the symptoms worsen or if the condition fails to improve as anticipated.  I provided 20 minutes of non-face-to-face time during this encounter.   Christine Novasarmen Bane Hagy, MD   I cannot perform a MMSE or MOCA over the phone , that would be left for repeat visit with NP. I will schedule a visit in 3 month.  I will start Aricept at 5 mg a day, for 30 days and we can increase to 10 mg after 30 days on Namenda at the same time.  Aricept may cause nausea or abdominal cramping , or diarrhea. I advised  the patient.    Porfirio Mylar Eutha Cude MD 10/10/2018

## 2018-10-18 ENCOUNTER — Telehealth: Payer: Self-pay | Admitting: Neurology

## 2018-10-18 ENCOUNTER — Ambulatory Visit: Payer: Medicare Other | Admitting: Adult Health

## 2018-10-18 NOTE — Telephone Encounter (Signed)
Called the patient to get her scheduled for a 3 mth follow up. No answer. LVM informing the pt to call back and schedule with NP

## 2018-10-20 ENCOUNTER — Telehealth: Payer: Self-pay | Admitting: Neurology

## 2018-10-20 NOTE — Telephone Encounter (Signed)
Pt states she is having diarrhea, headaches, weakness and is breaking out on the donepezil (ARICEPT) 5 MG tablet. Please advise.

## 2018-10-23 NOTE — Telephone Encounter (Signed)
Spoke with Dr Vickey Huger and she is fine with the patient stopping the medication and just documenting that we tried. Called the patient to advise her of this and she states that her symptoms had stopped. Advised that maybe it took her body a little time to get used to it. Advised that she can continue at current dose since the symptoms have resolved. She was appreciative.

## 2019-01-16 ENCOUNTER — Other Ambulatory Visit: Payer: Self-pay

## 2019-01-16 ENCOUNTER — Ambulatory Visit (INDEPENDENT_AMBULATORY_CARE_PROVIDER_SITE_OTHER): Payer: Medicare Other | Admitting: Family Medicine

## 2019-01-16 ENCOUNTER — Encounter: Payer: Self-pay | Admitting: Family Medicine

## 2019-01-16 VITALS — BP 144/86 | HR 73 | Temp 96.9°F | Ht 62.0 in | Wt 147.8 lb

## 2019-01-16 DIAGNOSIS — F411 Generalized anxiety disorder: Secondary | ICD-10-CM

## 2019-01-16 DIAGNOSIS — G3184 Mild cognitive impairment, so stated: Secondary | ICD-10-CM

## 2019-01-16 MED ORDER — MEMANTINE HCL ER 14 MG PO CP24: 14.0000 mg | ORAL_CAPSULE | Freq: Every day | ORAL | 11 refills | Status: DC

## 2019-01-16 NOTE — Progress Notes (Signed)
PATIENT: Christine Huynh DOB: 1940/09/30  REASON FOR VISIT: follow up HISTORY FROM: patient  Chief Complaint  Patient presents with  . Follow-up    New room, Mild cog impair, Memory     HISTORY OF PRESENT ILLNESS: Today 01/16/19 Christine Huynh is a 78 y.o. female here today for follow up for MCI. She was started on Aricept 5mg  in May after visit with Dr Vickey Hugerohmeier. She reports having horrible dreams after starting it. She is taking mamantine XR 7mg  daily. She is tolerating this well. She reports that overall she is doing ok. She continues to have trouble with short term memory. She has to write everything down. She reports that her family makes comments to her that she can't remember things. She admits that she is very anxious. She is taking Effexor 75mg  twice daily as well as Buspar 5mg  twice daily. She is uncertain if this helps. She feels that anxiety definitely contributes to memory loss.   HISTORY: (copied from Dr Dohmeier's note on 10/10/2018)  HPI: 10-10-2018  Christine Huynh is a 78 y.o. female patient has a history of insomnia, nocturia and memory loss.  She has a phone consult today and reports she is taking B12 and Namenda, She is willing to add now  Aricept at 5 mg, which can be increased in 60-90 days to 10 mg or 5 mg BIID .  She is seen here on 06-29-2018 in a referral from Dr. Omer JackMumaw for memory loss. Mrs. Hank describes that for the last 18 months she has been increasingly poor rate.  She describes situations where she just cannot find the right word in the midst of a conversation, cannot find a name and becomes increasingly frustrated.  She also displaces objects cannot find her keys or her pocket book.  She knows that nobody else has taken it but it is frustrating nonetheless to look for it. She has not gotten lost but she has noticed great difficulties with giving way descriptions to her husband.  If she wants to tell him how to drive or go towards a certain place she often  finds that her thoughts just break off she cannot go beyond a certain point, and sometimes she has just veered off the topic with something completely unrelated.  She feels like she is pulling at blank there is no way to try his back and try again to fill in the gaps. She still cannot estimate the cost of a certain shopping list, but she has to write things down more more often.  Her primary care physician provided a Mini-Mental Status Examination and the MMSE score of October 2018 was 29 out of 30 this was the first time she had mentioned to her her concern about memory.  The patient has the following past medical history listed, tinnitus, osteopenia both hips by DEXA scan identified as borderline osteoporotic in 2013.  She has gastroesophageal reflux disease, high magnesium high cholesterol levels, insomnia, hypertension, anxiety and irritable bowel, diverticulosis.  Patient's Father died with Alzheimer's dementia at age 77 but dx at age 77.    REVIEW OF SYSTEMS: Out of a complete 14 system review of symptoms, the patient complains only of the following symptoms, memory loss, dizziness, headaches and all other reviewed systems are negative.   ALLERGIES: Allergies  Allergen Reactions  . Ciprofloxacin   . Diclofenac   . Mobic [Meloxicam]   . Penicillins     HOME MEDICATIONS: Outpatient Medications Prior to Visit  Medication  Sig Dispense Refill  . ACETAMINOPHEN PO Take by mouth as needed.    . busPIRone (BUSPAR) 5 MG tablet Take 5 mg by mouth 2 (two) times daily.     . calcium carbonate (CALCIUM 600) 600 MG TABS tablet Take 600 mg by mouth 2 (two) times daily with a meal.     . Cyanocobalamin (VITAMIN B 12 PO) Take 1,000 mcg by mouth.    . diltiazem (CARDIZEM LA) 420 MG 24 hr tablet Take 420 mg by mouth daily.     . Melatonin 5 MG TABS Take 1 tablet by mouth at bedtime.    . Multiple Vitamins-Minerals (COMPLETE MULTIVITAMIN/MINERAL PO) Take by mouth.    . Multiple Vitamins-Minerals (TH  COMPLETE MULTI 50+) TABS Take 1 tablet by mouth daily.    [name] venlafaxine (EFFEXOR) 37.5 MG tablet Two tablets by mouth in the morning and two tablets by mouth at night    . memantine (NAMENDA XR) 7 MG CP24 24 hr capsule Take 7 mg by mouth daily.    [name] atorvastatin (LIPITOR) 10 MG tablet Take 10 mg by mouth daily at 6 PM.     . donepezil (ARICEPT) 5 MG tablet Take 1 tablet (5 mg total) by mouth at bedtime. 30 tablet 3  . memantine (NAMENDA) 10 MG tablet Take 1 tablet (10 mg total) by mouth 2 (two) times daily. 60 tablet 11  . omeprazole (PRILOSEC) 20 MG capsule TAKE ONE CAPSULE BY MOUTH EVERY DAY 30 capsule 3   Facility-Administered Medications Prior to Visit  Medication Dose Route Frequency Provider Last Rate Last Dose  . 0.9 %  sodium chloride infusion  500 mL Intravenous Continuous Pyrtle, Lajuan Lines, MD        PAST MEDICAL HISTORY: Past Medical History:  Diagnosis Date  . Anxiety   . Arthritis   . Depression   . Diverticulosis of colon (without mention of hemorrhage) 05/10/2007  . Family history of malignant neoplasm of gastrointestinal tract   . GERD (gastroesophageal reflux disease)   . Hypercholesterolemia   . Hypertension   . IBS (irritable bowel syndrome)   . Thyroid disease     PAST SURGICAL HISTORY: Past Surgical History:  Procedure Laterality Date  . CHOLECYSTECTOMY  01/16/2007  . KNEE SURGERY     right knee arthroscopic  . PARTIAL HYSTERECTOMY  1976    FAMILY HISTORY: Family History  Problem Relation Age of Onset  . Colon cancer Paternal Uncle   . Diabetes Mother   . Colon polyps Mother   . Heart disease Father   . Esophageal cancer Brother   . Pancreatic cancer Cousin        1 st cousin on mothers side     SOCIAL HISTORY: Social History   Socioeconomic History  . Marital status: Married    Spouse name: Not on file  . Number of children: 2  . Years of education: Not on file  . Highest education level: Not on file  Occupational History  . Occupation:  Retired    Fish farm manager: [employer]  . Financial resource strain: Not on file  . Food insecurity    Worry: Not on file    Inability: Not on file  . Transportation needs    Medical: Not on file    Non-medical: Not on file  Tobacco Use  . Smoking status: Never Smoker  . Smokeless tobacco: Never Used  Substance and Sexual Activity  . Alcohol use: No  . Drug use:  No  . Sexual activity: Not on file  Lifestyle  . Physical activity    Days per week: Not on file    Minutes per session: Not on file  . Stress: Not on file  Relationships  . Social Musicianconnections    Talks on phone: Not on file    Gets together: Not on file    Attends religious service: Not on file    Active member of club or organization: Not on file    Attends meetings of clubs or organizations: Not on file    Relationship status: Not on file  . Intimate partner violence    Fear of current or ex partner: Not on file    Emotionally abused: Not on file    Physically abused: Not on file    Forced sexual activity: Not on file  Other Topics Concern  . Not on file  Social History Narrative   No caffeine       PHYSICAL EXAM  Vitals:   01/16/19 0839  BP: (!) 144/86  Pulse: 73  Temp: (!) 96.9 F (36.1 C)  Weight: 147 lb 12.8 oz (67 kg)  Height: 5\' 2"  (1.575 m)   Body mass index is 27.03 kg/m.  Generalized: Well developed, in no acute distress  Cardiology: normal rate and rhythm, no murmur noted Neurological examination  Mentation: Alert oriented to time, place, history taking. Follows all commands speech and language fluent Cranial nerve II-XII: Pupils were equal round reactive to light. Extraocular movements were full, visual field were full on confrontational test. Facial sensation and strength were normal. Uvula tongue midline. Head turning and shoulder shrug  were normal and symmetric. Motor: The motor testing reveals 5 over 5 strength of all 4 extremities. Good symmetric motor tone  is noted throughout.  Sensory: Sensory testing is intact to soft touch on all 4 extremities. No evidence of extinction is noted.  Coordination: Cerebellar testing reveals good finger-nose-finger and heel-to-shin bilaterally.  Gait and station: Gait is normal.   DIAGNOSTIC DATA (LABS, IMAGING, TESTING) - I reviewed patient records, labs, notes, testing and imaging myself where available.  MMSE - Mini Mental State Exam 01/16/2019  Orientation to time 5  Orientation to Place 4  Registration 3  Attention/ Calculation 5  Recall 2  Language- name 2 objects 2  Language- repeat 1  Language- follow 3 step command 3  Language- read & follow direction 1  Write a sentence 1  Copy design 1  Copy design-comments named 10 animals  Total score 28     Lab Results  Component Value Date   WBC 7.0 06/29/2018   HGB 12.4 06/29/2018   HCT 36.8 06/29/2018   MCV 94 06/29/2018   PLT 358 06/29/2018      Component Value Date/Time   NA 141 06/29/2018 1521   K 4.3 06/29/2018 1521   CL 102 06/29/2018 1521   CO2 24 06/29/2018 1521   GLUCOSE 84 06/29/2018 1521   GLUCOSE 100 (H) 05/10/2016 1020   BUN 16 06/29/2018 1521   CREATININE 0.96 06/29/2018 1521   CALCIUM 9.4 06/29/2018 1521   PROT 6.9 06/29/2018 1521   ALBUMIN 4.5 06/29/2018 1521   AST 15 06/29/2018 1521   ALT 13 06/29/2018 1521   ALKPHOS 95 06/29/2018 1521   BILITOT <0.2 06/29/2018 1521   GFRNONAA 58 (L) 06/29/2018 1521   GFRAA 66 06/29/2018 1521   No results found for: CHOL, HDL, LDLCALC, LDLDIRECT, TRIG, CHOLHDL No results found for: [id number] Lab Results  Component Value Date   VITAMINB12 647 10/08/2011   Lab Results  Component Value Date   TSH 1.740 06/29/2018     ASSESSMENT AND PLAN 78 y.o. year old female  has a past medical history of Anxiety, Arthritis, Depression, Diverticulosis of colon (without mention of hemorrhage) (05/10/2007), Family history of malignant neoplasm of gastrointestinal tract, GERD (gastroesophageal  reflux disease), Hypercholesterolemia, Hypertension, IBS (irritable bowel syndrome), and Thyroid disease. 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 Anxiety, generalized  F41.1     Overall Christine Huynh is doing fairly well.  MMSE today is 28 out of 30.  She does note continued concerns of short-term memory loss.  She definitely notes anxiety as a contributing factor.  She is working closely with primary care to manage anxiety.  We will increase mamantine to 14mg  XR daily.  She was instructed to alternate 7 mg and 14 mg capsules daily for 2 weeks.  If well tolerated she can increase to 14 mg daily.  We have discussed potential side effects.  We have also discussed consideration of formal neurocognitive testing.  She declines at this time.  She was advised to follow-up with me in 6 months.  She verbalizes understanding and agreement with this plan.   No orders of the defined types were placed in this encounter.    Meds ordered this encounter  Medications  . memantine (NAMENDA XR) 14 MG CP24 24 hr capsule    Sig: Take 1 capsule (14 mg total) by mouth daily.    Dispense:  30 capsule    Refill:  11    Order Specific Question:   Supervising Provider    Answer:   Anson FretAHERN, ANTONIA B [id number]]      I spent 15 minutes with the patient. 50% of this time was spent counseling and educating patient on plan of care and medications.    Shawnie Dappermy Jobe Mutch, FNP-C 01/16/2019, 9:20 AM [facility] [address], [address] [place], [place] [address] [phone]

## 2019-01-16 NOTE — Patient Instructions (Addendum)
We will increase mamantine to 14mg  daily (we will alternate 7mg  and 14mg  capsules daily for two weeks then increase to 14mg  daily)  Continue regular activity  Consider formal cognitive testing  Please continue working closely with PCP for anxiety management  Follow up with me in 6 months   Memory Compensation Strategies  1. Use "WARM" strategy.  W= write it down  A= associate it  R= repeat it  M= make a mental note  2.   You can keep a Glass blower/designerMemory Notebook.  Use a 3-ring notebook with sections for the following: calendar, important names and phone numbers,  medications, doctors' names/phone numbers, lists/reminders, and a section to journal what you did  each day.   3.    Use a calendar to write appointments down.  4.    Write yourself a schedule for the day.  This can be placed on the calendar or in a separate section of the Memory Notebook.  Keeping a  regular schedule can help memory.  5.    Use medication organizer with sections for each day or morning/evening pills.  You may need help loading it  6.    Keep a basket, or pegboard by the door.  Place items that you need to take out with you in the basket or on the pegboard.  You may also want to  include a message board for reminders.  7.    Use sticky notes.  Place sticky notes with reminders in a place where the task is performed.  For example: " turn off the  stove" placed by the stove, "lock the door" placed on the door at eye level, " take your medications" on  the bathroom mirror or by the place where you normally take your medications.  8.    Use alarms/timers.  Use while cooking to remind yourself to check on food or as a reminder to take your medicine, or as a  reminder to make a call, or as a reminder to perform another task, etc.   Dementia Dementia is a condition that affects the way the brain works. It often affects memory and thinking. There are many types of dementia. Some types get worse with time and cannot be  reversed. Some types of dementia include:  Alzheimer's disease. This is the most common type.  Vascular dementia. This type may happen due to a stroke.  Lewy body dementia. This type may happen to people who have Parkinson's disease.  Frontotemporal dementia. This type is caused by damage to nerve cells in certain parts of the brain. Some people may have more than one type, and this is called mixed dementia. What are the causes? This condition is caused by damage to cells in the brain. Some causes that cannot be reversed include:  Having a condition that affects the blood vessels of the brain, such as diabetes, heart disease, or blood vessel disease.  Changes to genes. Some causes that can be reversed or slowed include:  Injury to the brain.  Certain medicines.  Infection.  Not having enough vitamin B12 in the body, or thyroid problems.  A tumor or blood clot in the brain. What are the signs or symptoms? Symptoms depend on the type of dementia. This may include:  Problems remembering things.  Having trouble taking a bath or putting clothes on.  Forgetting appointments.  Forgetting to pay bills.  Trouble planning and making meals.  Having trouble speaking.  Getting lost easily. How is this treated? Treatment  depends on the cause of the dementia. It might include taking medicines that help:  To control the dementia.  To slow down the dementia.  To manage symptoms. In some cases, treating the cause of your dementia can improve symptoms, reverse symptoms, or slow down how quickly it gets worse. Your doctor can help you find support groups and other doctors who can help with your care. Follow these instructions at home: Medicines  Take over-the-counter and prescription medicines only as told by your doctor.  Use a pill organizer to help you manage your medicines.  Avoidtaking medicines for pain or for sleep. Lifestyle  Make healthy choices: ? Be active as  told by your doctor. ? Do not use any products that contain nicotine or tobacco, such as cigarettes, e-cigarettes, and chewing tobacco. If you need help quitting, ask your doctor. ? Do not drink alcohol. ? When you get stressed, do something that will help you to relax. Your doctor can give you tips. ? Spend time with other people.  Make sure you get good sleep. To get good sleep: ? Try not to take naps during the day. ? Keep your bedroom dark and cool. ? In the few hours before you go to bed, try not to do any exercise. ? Do not have foods and drinks with caffeine at night. Eating and drinking  Drink enough fluid to keep your pee (urine) pale yellow.  Eat a healthy diet. General instructions   Talk with your doctor to figure out: ? What you need help with. ? What your safety needs are.  Ask your doctor if it is safe for you to drive.  If told, wear a bracelet that tracks where you are or shows that you are a person with memory loss.  Work with your family to make big decisions.  Keep all follow-up visits as told by your doctor. This is important. Contact a doctor if:  You have any new symptoms.  Your symptoms get worse.  You have problems with swallowing or choking. Get help right away if:  You feel very sad, or feel that you want to harm yourself.  You or your family members are worried for your safety. If you ever feel like you may hurt yourself or others, or have thoughts about taking your own life, get help right away. You can go to your nearest emergency department or call:  Your local emergency services (911 in the [place]  A suicide crisis helpline, such as the [facility] at (289)269-4352. This is open 24 hours a day. Summary  Dementia often affects memory and thinking.  Some types of dementia get worse with time and cannot be reversed.  Treatment for this condition depends on the cause.  Talk with your doctor to figure out what  you need help with.  Your doctor can help you find support groups and other doctors who can help with your care. This information is not intended to replace advice given to you by your health care provider. Make sure you discuss any questions you have with your health care provider. Document Released: 05/06/2008 Document Revised: 08/08/2018 Document Reviewed: 08/08/2018 Elsevier Patient Education  2020 [employer].

## 2019-07-19 ENCOUNTER — Encounter: Payer: Self-pay | Admitting: Family Medicine

## 2019-07-19 ENCOUNTER — Other Ambulatory Visit: Payer: Self-pay

## 2019-07-19 ENCOUNTER — Ambulatory Visit: Payer: Medicare PPO | Admitting: Family Medicine

## 2019-07-19 VITALS — BP 144/78 | HR 85 | Temp 97.7°F | Ht 62.0 in | Wt 140.8 lb

## 2019-07-19 DIAGNOSIS — F411 Generalized anxiety disorder: Secondary | ICD-10-CM | POA: Diagnosis not present

## 2019-07-19 DIAGNOSIS — G3184 Mild cognitive impairment, so stated: Secondary | ICD-10-CM | POA: Diagnosis not present

## 2019-07-19 MED ORDER — MEMANTINE HCL ER 7 MG PO CP24: 7.0000 mg | ORAL_CAPSULE | Freq: Every day | ORAL | 11 refills | Status: AC

## 2019-07-19 MED ORDER — MEMANTINE HCL ER 7 MG PO CP24: 14.0000 mg | ORAL_CAPSULE | Freq: Every day | ORAL | 11 refills | Status: DC

## 2019-07-19 NOTE — Progress Notes (Signed)
PATIENT: Christine Huynh DOB: [birth year]  REASON FOR VISIT: follow up HISTORY FROM: patient  Chief Complaint  Patient presents with  . Follow-up    Rm8. With son. When stressed her memory worsens. Worries alot.      HISTORY OF PRESENT ILLNESS: Today 07/19/19 Christine Huynh is a 79 y.o. female here today for follow up for MCI.  She was previously taking memantine 7 mg daily.  This dose was increased to 14 mg daily at her last visit in August.  She reports taking this medication initially but has not refilled this medication since September.  She is uncertain why.  She has recently gotten married.  She has moved into a new home.  She feels that stress of these changes may have contributed to her forgetting to refill medication.  She does not remember having any adverse effects with medications.  She feels that memory is fairly stable.  She does continue to have difficulty recalling short-term information.  She continues to note the anxiety contributes.  She admits that she takes on too much at 1 time.  She is able to perform all ADLs.  She is driving without difficulty.  She is taking her medications daily without missed doses.  Her son is with her today and feels that overall she is doing very well but does spread herself too thin.  He feels that her anxiety definitely contributes.    HISTORY: (copied from my note on 01/16/2019)  Christine Huynh is a 79 y.o. female here today for follow up for MCI. She was started on Aricept 5mg  in May after visit with Dr June. She reports having horrible dreams after starting it. She is taking mamantine XR 7mg  daily. She is tolerating this well. She reports that overall she is doing ok. She continues to have trouble with short term memory. She has to write everything down. She reports that her family makes comments to her that she can't remember things. She admits that she is very anxious. She is taking Effexor 75mg  twice daily as well as Buspar 5mg  twice  daily. She is uncertain if this helps. She feels that anxiety definitely contributes to memory loss.   HISTORY: (copied from Dr Dohmeier's note on 10/10/2018)  HPI:5-5-2020Judy A McCurdyis a 79 y.o.femalepatient has a history of insomnia, nocturia and memory loss. She has a phone consult today and reports she is taking B12 and Namenda, She is willing to add now Aricept at 5 mg, which can be increased in 60-90 days to 10 mg or 5 mg BIID .  She is seen here on 06-29-2018 in a referral from Dr. 12-10-2018 for memory loss. Christine Huynh describes that for the last 18 months she has been increasingly poor rate. She describes situations where she just cannot find the right word in the midst of a conversation, cannot find a name and becomes increasingly frustrated. She also displaces objects cannot find her keys or her pocket book. She knows that nobody else has taken it but it is frustrating nonetheless to look for it. She has not gotten lost but she has noticed great difficulties with giving way descriptions to her husband. If she wants to tell him how to drive or go towards a certain place she often finds that her thoughts just break off she cannot go beyond a certain point, and sometimes she has just veered off the topic with something completely unrelated. She feels like she is pulling at blank there is no  way to try his back and try again to fill in the gaps. She still cannot estimate the cost of a certain shopping list, but she has to write things down more more often.  Her primary care physician provided a Mini-Mental Status Examination and the MMSE score of October 2018 was 29 out of 30 this was the first time she had mentioned to her her concern about memory. The patient has the following past medical history listed, tinnitus, osteopenia both hips by DEXA scan identified as borderline osteoporotic in 2013. She has gastroesophageal reflux disease, high magnesium high cholesterol levels,  insomnia, hypertension, anxiety and irritable bowel, diverticulosis.  Patient's Father died with Alzheimer's dementia at age 42 but dx at age 45.    REVIEW OF SYSTEMS: Out of a complete 14 system review of symptoms, the patient complains only of the following symptoms, memory loss, anxiety and all other reviewed systems are negative.  ALLERGIES:  Allergies  Allergen Reactions  . Ciprofloxacin   . Diclofenac   . Mobic [Meloxicam]   . Penicillins     HOME MEDICATIONS: Outpatient Medications Prior to Visit  Medication Sig Dispense Refill  . ACETAMINOPHEN PO Take by mouth as needed.    [name] atorvastatin (LIPITOR) 10 MG tablet Take 10 mg by mouth daily.    [name] b complex vitamins tablet Take 1 tablet by mouth daily.    . busPIRone (BUSPAR) 5 MG tablet Take 5 mg by mouth 2 (two) times daily.     [name] co-enzyme Q-10 30 MG capsule Take 30 mg by mouth 3 (three) times daily.    [name] diltiazem (CARDIZEM LA) 420 MG 24 hr tablet Take 420 mg by mouth daily.     . Melatonin 5 MG TABS Take 1 tablet by mouth at bedtime.    . Multiple Vitamins-Minerals (TH COMPLETE MULTI 50+) TABS Take 1 tablet by mouth daily.    [name] venlafaxine (EFFEXOR) 37.5 MG tablet Two tablets by mouth in the morning and two tablets by mouth at night    . memantine (NAMENDA XR) 14 MG CP24 24 hr capsule Take 1 capsule (14 mg total) by mouth daily. 30 capsule 11  . calcium carbonate (CALCIUM 600) 600 MG TABS tablet Take 600 mg by mouth 2 (two) times daily with a meal.     . Cyanocobalamin (VITAMIN B 12 PO) Take 1,000 mcg by mouth.    . Multiple Vitamins-Minerals (COMPLETE MULTIVITAMIN/MINERAL PO) Take by mouth.     Facility-Administered Medications Prior to Visit  Medication Dose Route Frequency Provider Last Rate Last Admin  . 0.9 %  sodium chloride infusion  500 mL Intravenous Continuous Pyrtle, Carie Caddy, MD        PAST MEDICAL HISTORY: Past Medical History:  Diagnosis Date  . Anxiety   . Arthritis   . Depression   . Diverticulosis  of colon (without mention of hemorrhage) 05/10/2007  . Family history of malignant neoplasm of gastrointestinal tract   . GERD (gastroesophageal reflux disease)   . Hypercholesterolemia   . Hypertension   . IBS (irritable bowel syndrome)   . Thyroid disease     PAST SURGICAL HISTORY: Past Surgical History:  Procedure Laterality Date  . CHOLECYSTECTOMY  01/16/2007  . KNEE SURGERY     right knee arthroscopic  . PARTIAL HYSTERECTOMY  1976    FAMILY HISTORY: Family History  Problem Relation Age of Onset  . Colon cancer Paternal Uncle   . Diabetes Mother   . Colon polyps Mother   .  Heart disease Father   . Esophageal cancer Brother   . Pancreatic cancer Cousin        1 st cousin on mothers side     SOCIAL HISTORY: Social History   Socioeconomic History  . Marital status: Married    Spouse name: Not on file  . Number of children: 2  . Years of education: Not on file  . Highest education level: Not on file  Occupational History  . Occupation: Retired    Associate Professor: [employer]  Tobacco Use  . Smoking status: Never Smoker  . Smokeless tobacco: Never Used  Substance and Sexual Activity  . Alcohol use: No  . Drug use: No  . Sexual activity: Not on file  Other Topics Concern  . Not on file  Social History Narrative   No caffeine    Social Determinants of Health   Financial Resource Strain:   . Difficulty of Paying Living Expenses: Not on file  Food Insecurity:   . Worried About Programme researcher, broadcasting/film/video in the Last Year: Not on file  . Ran Out of Food in the Last Year: Not on file  Transportation Needs:   . Lack of Transportation (Medical): Not on file  . Lack of Transportation (Non-Medical): Not on file  Physical Activity:   . Days of Exercise per Week: Not on file  . Minutes of Exercise per Session: Not on file  Stress:   . Feeling of Stress : Not on file  Social Connections:   . Frequency of Communication with Friends and Family: Not on file  .  Frequency of Social Gatherings with Friends and Family: Not on file  . Attends Religious Services: Not on file  . Active Member of Clubs or Organizations: Not on file  . Attends Banker Meetings: Not on file  . Marital Status: Not on file  Intimate Partner Violence:   . Fear of Current or Ex-Partner: Not on file  . Emotionally Abused: Not on file  . Physically Abused: Not on file  . Sexually Abused: Not on file      PHYSICAL EXAM  Vitals:   07/19/19 0927  BP: (!) 144/78  Pulse: 85  Temp: 97.7 F (36.5 C)  Weight: 140 lb 12.8 oz (63.9 kg)  Height: 5\' 2"  (1.575 m)   Body mass index is 25.75 kg/m.  Generalized: Well developed, in no acute distress  Cardiology: normal rate and rhythm, no murmur noted Respiratory: Clear to auscultation bilaterally Neurological examination  Mentation: Alert oriented to time, place, history taking. Follows all commands speech and language fluent Cranial nerve II-XII: Pupils were equal round reactive to light. Extraocular movements were full, visual field were full  Motor: The motor testing reveals 5 over 5 strength of all 4 extremities. Good symmetric motor tone is noted throughout.  Gait and station: Gait is normal.   DIAGNOSTIC DATA (LABS, IMAGING, TESTING) - I reviewed patient records, labs, notes, testing and imaging myself where available.  MMSE - Mini Mental State Exam 07/19/2019 01/16/2019  Orientation to time 4 5  Orientation to Place 5 4  Registration 3 3  Attention/ Calculation 2 5  Recall 3 2  Language- name 2 objects 2 2  Language- repeat 1 1  Language- follow 3 step command 3 3  Language- read & follow direction 1 1  Write a sentence 1 1  Copy design 1 1  Copy design-comments - named 10 animals  Total score 26 28  Lab Results  Component Value Date   WBC 7.0 06/29/2018   HGB 12.4 06/29/2018   HCT 36.8 06/29/2018   MCV 94 06/29/2018   PLT 358 06/29/2018      Component Value Date/Time   NA 141  06/29/2018 1521   K 4.3 06/29/2018 1521   CL 102 06/29/2018 1521   CO2 24 06/29/2018 1521   GLUCOSE 84 06/29/2018 1521   GLUCOSE 100 (H) 05/10/2016 1020   BUN 16 06/29/2018 1521   CREATININE 0.96 06/29/2018 1521   CALCIUM 9.4 06/29/2018 1521   PROT 6.9 06/29/2018 1521   ALBUMIN 4.5 06/29/2018 1521   AST 15 06/29/2018 1521   ALT 13 06/29/2018 1521   ALKPHOS 95 06/29/2018 1521   BILITOT <0.2 06/29/2018 1521   GFRNONAA 58 (L) 06/29/2018 1521   GFRAA 66 06/29/2018 1521   No results found for: CHOL, HDL, LDLCALC, LDLDIRECT, TRIG, CHOLHDL No results found for: HGBA1C Lab Results  Component Value Date   VITAMINB12 647 10/08/2011   Lab Results  Component Value Date   TSH 1.740 06/29/2018    ASSESSMENT AND PLAN 79 y.o. year old female  has a past medical history of Anxiety, Arthritis, Depression, Diverticulosis of colon (without mention of hemorrhage) (05/10/2007), Family history of malignant neoplasm of gastrointestinal tract, GERD (gastroesophageal reflux disease), Hypercholesterolemia, Hypertension, IBS (irritable bowel syndrome), and Thyroid disease. 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 Anxiety, generalized  F41.1     Johnisha reports that overall her memory is fairly stable.  She is uncertain why she has not continued memantine.  She feels that increased stress from a recent move and new marriage may have contributed.  She does not remember having any adverse effects with medication.  She does risk to resume Namenda.  We will resume 7 mg dosing daily.  We will increase to 14 mg if well tolerated.  She was given Scientist, clinical (histocompatibility and immunogenetics) on memory compensation strategies as well as side effects of Namenda.  She will follow-up with me in 3 months, sooner if needed.  She verbalizes understanding and agreement with this plan.   No orders of the defined types were placed in this encounter.    Meds ordered this encounter    Medications  . DISCONTD: memantine (NAMENDA XR) 7 MG CP24 24 hr capsule    Sig: Take 2 capsules (14 mg total) by mouth daily.    Dispense:  30 capsule    Refill:  11    Order Specific Question:   Supervising Provider    Answer:   Melvenia Beam [id number]  . memantine (NAMENDA XR) 7 MG CP24 24 hr capsule    Sig: Take 1 capsule (7 mg total) by mouth daily.    Dispense:  30 capsule    Refill:  11    Order Specific Question:   Supervising Provider    Answer:   Melvenia Beam [id number]      I spent 20 minutes with the patient. 50% of this time was spent counseling and educating patient on plan of care and medications.    Debbora Presto, FNP-C 07/19/2019, 10:09 AM [facility] [address] [place], [place] [address] [phone]

## 2019-07-19 NOTE — Patient Instructions (Addendum)
We will resume memantine 7mg  daily   Stay well hydrated and regular exercise advised  Follow up closely with PCP for anxiety  Follow up with [place] in 3 months   Memory Compensation Strategies  1. Use "WARM" strategy.  W= write it down  A= associate it  R= repeat it  M= make a mental note  2.   You can keep a Social worker.  Use a 3-ring notebook with sections for the following: calendar, important names and phone numbers,  medications, doctors' names/phone numbers, lists/reminders, and a section to journal what you did  each day.   3.    Use a calendar to write appointments down.  4.    Write yourself a schedule for the day.  This can be placed on the calendar or in a separate section of the Memory Notebook.  Keeping a  regular schedule can help memory.  5.    Use medication organizer with sections for each day or morning/evening pills.  You may need help loading it  6.    Keep a basket, or pegboard by the door.  Place items that you need to take out with you in the basket or on the pegboard.  You may also want to  include a message board for reminders.  7.    Use sticky notes.  Place sticky notes with reminders in a place where the task is performed.  For example: " turn off the  stove" placed by the stove, "lock the door" placed on the door at eye level, " take your medications" on  the bathroom mirror or by the place where you normally take your medications.  8.    Use alarms/timers.  Use while cooking to remind yourself to check on food or as a reminder to take your  Memantine extended release capsules What is this medicine? MEMANTINE (MEM an teen) is used to treat dementia caused by Alzheimer's disease. This medicine may be used for other purposes; ask your health care provider or pharmacist if you have questions. COMMON BRAND NAME(S): Namenda XR What should I tell my health care provider before I take this medicine? They need to know if you have any of these  conditions:  difficulty passing urine  kidney disease  liver disease  seizures  an unusual or allergic reaction to memantine, other medicines, foods, dyes, or preservatives  pregnant or trying to get pregnant  breast-feeding How should I use this medicine? Take this medicine by mouth with a glass of water. Follow the directions on the prescription label. You may take this medicine with or without food. You may swallow the capsules whole or open them and sprinkle the entire contents on applesauce before swallowing. Other than sprinkling the medicine on applesauce, the capsules should be swallowed whole and not divided, chewed, or crushed. Take your doses at regular intervals. Do not take your medicine more often than directed. Continue to take your medicine even if you feel better. Do not stop taking except on the advice of your doctor or health care professional. Talk to your pediatrician regarding the use of this medicine in children. Special care may be needed. Overdosage: If you think you have taken too much of this medicine contact a poison control center or emergency room at once. NOTE: This medicine is only for you. Do not share this medicine with others. What if I miss a dose? If you miss a dose, take it as soon as you can. If it is almost  time for your next dose, take only that dose. Do not take double or extra doses. If you do not take your medicine for several days, contact your health care provider. Your dose may need to be changed. What may interact with this medicine?  acetazolamide  amantadine  cimetidine  dextromethorphan  dofetilide  hydrochlorothiazide  ketamine  metformin  methazolamide  quinidine  ranitidine  sodium bicarbonate  triamterene This list may not describe all possible interactions. Give your health care provider a list of all the medicines, herbs, non-prescription drugs, or dietary supplements you use. Also tell them if you smoke, drink  alcohol, or use illegal drugs. Some items may interact with your medicine. What should I watch for while using this medicine? Visit your doctor or health care professional for regular checks on your progress. Check with your doctor or health care professional if there is no improvement in your symptoms or if they get worse. You may get drowsy or dizzy. Do not drive, use machinery, or do anything that needs mental alertness until you know how this drug affects you. Do not stand or sit up quickly, especially if you are an older patient. This reduces the risk of dizzy or fainting spells. Alcohol can make you more drowsy and dizzy. Avoid alcoholic drinks. What side effects may I notice from receiving this medicine? Side effects that you should report to your doctor or health care professional as soon as possible:  agitation or a feeling of restlessness  allergic reactions like skin rash, itching or hives, swelling of the face, lips, or tongue  depressed mood  dizziness  hallucinations  redness, blistering, peeling or loosening of the skin, including inside the mouth  seizures  vomiting Side effects that usually do not require medical attention (report to your doctor or health care professional if they continue or are bothersome):  constipation  diarrhea  headache  nausea  trouble sleeping This list may not describe all possible side effects. Call your doctor for medical advice about side effects. You may report side effects to FDA at 1-800-FDA-1088. Where should I keep my medicine? Keep out of the reach of children. Store at room temperature between 15 degrees and 30 degrees C (59 degrees and 86 degrees F). Throw away any unused medicine after the expiration date. NOTE: This sheet is a summary. It may not cover all possible information. If you have questions about this medicine, talk to your doctor, pharmacist, or health care provider.  2020 Elsevier/Gold Standard (2015-06-26  10:09:47)  medicine, or as a  reminder to make a call, or as a reminder to perform another task, etc.

## 2019-07-19 NOTE — Progress Notes (Signed)
I called and spoke to [place] at the pharmacy, cancelled the BID namenda and told to do namenda 7mg  po daily dosing prescription.

## 2019-10-18 ENCOUNTER — Encounter: Payer: Self-pay | Admitting: Family Medicine

## 2019-10-18 ENCOUNTER — Ambulatory Visit: Payer: Medicare PPO | Admitting: Family Medicine

## 2022-01-26 ENCOUNTER — Encounter: Payer: Self-pay | Admitting: Internal Medicine
